# Patient Record
Sex: Female | Born: 1950 | Hispanic: Yes | Marital: Married | Smoking: Never smoker
Health system: Southern US, Community
[De-identification: ages and names within clinical notes are randomized; demographics above are authoritative.]

## PROBLEM LIST (undated history)

## (undated) DIAGNOSIS — M1990 Unspecified osteoarthritis, unspecified site: Secondary | ICD-10-CM

## (undated) DIAGNOSIS — F419 Anxiety disorder, unspecified: Secondary | ICD-10-CM

## (undated) DIAGNOSIS — Z87442 Personal history of urinary calculi: Secondary | ICD-10-CM

## (undated) DIAGNOSIS — E079 Disorder of thyroid, unspecified: Secondary | ICD-10-CM

## (undated) DIAGNOSIS — E7800 Pure hypercholesterolemia, unspecified: Secondary | ICD-10-CM

## (undated) DIAGNOSIS — F32A Depression, unspecified: Secondary | ICD-10-CM

## (undated) DIAGNOSIS — N200 Calculus of kidney: Secondary | ICD-10-CM

## (undated) DIAGNOSIS — D649 Anemia, unspecified: Secondary | ICD-10-CM

## (undated) DIAGNOSIS — I10 Essential (primary) hypertension: Secondary | ICD-10-CM

## (undated) DIAGNOSIS — J45909 Unspecified asthma, uncomplicated: Secondary | ICD-10-CM

## (undated) HISTORY — PX: ABDOMINAL HYSTERECTOMY: SHX81

---

## 2018-06-09 ENCOUNTER — Other Ambulatory Visit: Payer: Self-pay

## 2018-06-09 ENCOUNTER — Ambulatory Visit
Admission: EM | Admit: 2018-06-09 | Discharge: 2018-06-09 | Disposition: A | Discharge status: Home / Self Care | Payer: Medicare (Managed Care) | Attending: Family Medicine | Admitting: Family Medicine

## 2018-06-09 ENCOUNTER — Encounter: Payer: Self-pay | Admitting: Emergency Medicine

## 2018-06-09 DIAGNOSIS — M545 Low back pain, unspecified: Secondary | ICD-10-CM

## 2018-06-09 DIAGNOSIS — S01512A Laceration without foreign body of oral cavity, initial encounter: Secondary | ICD-10-CM

## 2018-06-09 DIAGNOSIS — R6884 Jaw pain: Secondary | ICD-10-CM | POA: Diagnosis not present

## 2018-06-09 DIAGNOSIS — W19XXXA Unspecified fall, initial encounter: Secondary | ICD-10-CM | POA: Diagnosis not present

## 2018-06-09 DIAGNOSIS — W228XXA Striking against or struck by other objects, initial encounter: Secondary | ICD-10-CM

## 2018-06-09 HISTORY — DX: Essential (primary) hypertension: I10

## 2018-06-09 HISTORY — DX: Pure hypercholesterolemia, unspecified: E78.00

## 2018-06-09 HISTORY — DX: Disorder of thyroid, unspecified: E07.9

## 2018-06-09 NOTE — ED Provider Notes (Signed)
[facility]    CSN: [id number] Arrival date & time: 06/09/18  1612     History   Chief Complaint Chief Complaint  Patient presents with  . Fall    HPI Carrie Bryant is a 67 y.o. female.   HPI 67 year old female presents today accompanied by her daughter's girlfriend she fell in the bathroom earlier today.  The patient is visiting from [place].  States that she slipped while getting out of the tub and hit her left side of her jaw against the tub itself.  She lacerated her tongue and gums.  Biggest complaint however seems to be the lower jaw on the left side of her face.  The patient is essentially edentulous.  Not endorse loss of consciousness.  Not on anticoagulation medications.  Is also complaining of right-sided low back pain.  A walking boot from previous injury.        Past Medical History:  Diagnosis Date  . Hypercholesterolemia   . Hypertension   . Thyroid disease     There are no active problems to display for this patient.   Past Surgical History:  Procedure Laterality Date  . ABDOMINAL HYSTERECTOMY      OB History   None      Home Medications    Prior to Admission medications   Medication Sig Start Date End Date Taking? Authorizing Provider  clonazePAM (KLONOPIN) 2 MG tablet Take 2 mg by mouth 2 (two) times daily.   Yes [provider]  enalapril (VASOTEC) 10 MG tablet Take 10 mg by mouth daily.   Yes [provider]  Levothyroxine Sodium (SYNTHROID PO) Take by mouth.   Yes [provider]  simvastatin (ZOCOR) 20 MG tablet Take 20 mg by mouth daily.   Yes [provider]  traZODone (DESYREL) 150 MG tablet Take by mouth at bedtime.   Yes [provider]    Family History History reviewed. No pertinent family history.  Social History Social History   Tobacco Use  . Smoking status: Never Smoker  . Smokeless tobacco: Never Used  Substance Use Topics  . Alcohol use: Never   Frequency: Never  . Drug use: Never     Allergies   Codeine   Review of Systems Review of Systems  Constitutional: Positive for activity change. Negative for appetite change, chills, fatigue and fever.  HENT: Positive for facial swelling and mouth sores.   Musculoskeletal: Positive for back pain.  All other systems reviewed and are negative.    Physical Exam Triage Vital Signs ED Triage Vitals  Enc Vitals Group     BP 06/09/18 1646 (!) 146/91     Pulse Rate 06/09/18 1646 75     Resp 06/09/18 1646 18     Temp 06/09/18 1646 98.3 F (36.8 C)     Temp Source 06/09/18 1646 Oral     SpO2 06/09/18 1646 97 %     Weight 06/09/18 1653 138 lb (62.6 kg)     Height 06/09/18 1653 5' (1.524 m)     Head Circumference --      Peak Flow --      Pain Score 06/09/18 1652 8     Pain Loc --      Pain Edu? --      Excl. in [place]? --    No data found.  Updated Vital Signs BP (!) 146/91 (BP Location: Left Arm)   Pulse 75   Temp 98.3 F (36.8 C) (Oral)  Resp 18   Ht 5' (1.524 m)   Wt 138 lb (62.6 kg)   SpO2 97%   BMI 26.95 kg/m   Visual Acuity Right Eye Distance:   Left Eye Distance:   Bilateral Distance:    Right Eye Near:   Left Eye Near:    Bilateral Near:     Physical Exam  Constitutional: She is oriented to person, place, and time. She appears well-developed and well-nourished. No distress.  HENT:  Head: Normocephalic.  Eyes: Pupils are equal, round, and reactive to light. EOM are normal.  Neck: Normal range of motion.  Emanation of the cervical spine shows no tenderness of the paraspinous muscles or along the spinous processes.  The face shows a swelling of the mandible on the left.  No crepitus is felt.  Patient has less than 1 cm laceration of the knee and the left distal and underneath the tongue.  Has small laceration on the upper gumline.  Patient is edentulous.    Musculoskeletal: She exhibits tenderness.  She has tenderness to palpation of the right flank and  lumbar paraspinous muscles on the right.  Neurological: She is alert and oriented to person, place, and time.  Skin: Skin is warm and dry. She is not diaphoretic.  Psychiatric: She has a normal mood and affect. Her behavior is normal. Judgment and thought content normal.  Nursing note and vitals reviewed.    UC Treatments / Results  Labs (all labs ordered are listed, but only abnormal results are displayed) Labs Reviewed - No data to display  EKG None  Radiology No results found.  Procedures Procedures (including critical care time)  Medications Ordered in UC Medications - No data to display  Initial Impression / Assessment and Plan / UC Course  I have reviewed the triage vital signs and the nursing notes.  Pertinent labs & imaging results that were available during my care of the patient were reviewed by me and considered in my medical decision making (see chart for details).   Explained to the patient and her companion that do not have a CAT scan available to [place] tonight.  Is likely that a CAT scan would be necessary to evaluate the jaw pain that she has since the fall.  Tongue does not appear to need suturing.  Will also need to be evaluated for the cervical spine as well as the lumbar spine.  She will be transported by the companion to [facility] emergency department as there are choice.  She was stable at the time of discharge.   Final Clinical Impressions(s) / UC Diagnoses   Final diagnoses:  Jaw pain, non-TMJ  Tongue laceration, initial encounter  Acute right-sided low back pain, unspecified whether sciatica present  Fall, initial encounter   Discharge Instructions   None    ED Prescriptions    None     Controlled Substance Prescriptions [place] Controlled Substance Registry consulted? Not Applicable   Lorin Picket, PA-C 06/09/18 1827

## 2018-06-09 NOTE — ED Triage Notes (Signed)
Patient visiting from [place]

## 2018-06-09 NOTE — ED Triage Notes (Signed)
Patient c/o fall in the bathroom today. She stated she hit the left side of her jaw and face when she fell. Patient stated her tongue is ripped.

## 2019-09-28 ENCOUNTER — Ambulatory Visit: Payer: Medicare (Managed Care) | Attending: Internal Medicine

## 2019-09-28 DIAGNOSIS — Z23 Encounter for immunization: Secondary | ICD-10-CM

## 2019-09-28 NOTE — Progress Notes (Signed)
   Covid-19 Vaccination Clinic  Name:  Carrie Bryant    MRN: [id number] DOB: 17-Oct-1950  09/28/2019  Ms. Carrie Bryant was observed post Covid-19 immunization for 15 minutes without incident. She was provided with Vaccine Information Sheet and instruction to access the V-Safe system.   Ms. Carrie Bryant was instructed to call 911 with any severe reactions post vaccine: Marland Kitchen Difficulty breathing  . Swelling of face and throat  . A fast heartbeat  . A bad rash all over body  . Dizziness and weakness   Immunizations Administered    Name Date Dose VIS Date Route   Pfizer COVID-19 Vaccine 09/28/2019 11:37 AM 0.3 mL 06/12/2019 Intramuscular   Manufacturer: [place]   Lot: [id number]   [place]: [id number]

## 2019-10-19 ENCOUNTER — Ambulatory Visit: Payer: Medicare HMO | Attending: Internal Medicine

## 2019-10-19 DIAGNOSIS — Z23 Encounter for immunization: Secondary | ICD-10-CM

## 2019-10-19 NOTE — Progress Notes (Signed)
   Covid-19 Vaccination Clinic  Name:  Carrie Bryant    MRN: [id number] DOB: 09/13/1950  10/19/2019  Ms. Carrie Bryant was observed post Covid-19 immunization for 15 minutes without incident. She was provided with Vaccine Information Sheet and instruction to access the V-Safe system.   Ms. Carrie Bryant was instructed to call 911 with any severe reactions post vaccine: Marland Kitchen Difficulty breathing  . Swelling of face and throat  . A fast heartbeat  . A bad rash all over body  . Dizziness and weakness   Immunizations Administered    Name Date Dose VIS Date Route   Pfizer COVID-19 Vaccine 10/19/2019 10:16 AM 0.3 mL 08/26/2018 Intramuscular   Manufacturer: [employer]   Lot: [id number]   [place]: [id number]

## 2020-01-07 ENCOUNTER — Ambulatory Visit (INDEPENDENT_AMBULATORY_CARE_PROVIDER_SITE_OTHER): Payer: Medicare HMO

## 2020-01-07 ENCOUNTER — Other Ambulatory Visit: Payer: Self-pay

## 2020-01-07 ENCOUNTER — Ambulatory Visit
Admission: EM | Admit: 2020-01-07 | Discharge: 2020-01-07 | Disposition: A | Discharge status: Home / Self Care | Payer: Medicare HMO | Attending: Internal Medicine | Admitting: Internal Medicine

## 2020-01-07 DIAGNOSIS — J4521 Mild intermittent asthma with (acute) exacerbation: Secondary | ICD-10-CM | POA: Diagnosis not present

## 2020-01-07 DIAGNOSIS — J189 Pneumonia, unspecified organism: Secondary | ICD-10-CM

## 2020-01-07 HISTORY — DX: Unspecified asthma, uncomplicated: J45.909

## 2020-01-07 MED ORDER — METHYLPREDNISOLONE SODIUM SUCC 40 MG IJ SOLR
60.0000 mg | Freq: Once | INTRAMUSCULAR | Status: AC
  Administered 2020-01-07: 60 mg via INTRAMUSCULAR

## 2020-01-07 MED ORDER — AMOXICILLIN-POT CLAVULANATE 875-125 MG PO TABS: 1.0000 | ORAL_TABLET | Freq: Two times a day (BID) | ORAL | 0 refills | Status: DC

## 2020-01-07 MED ORDER — ALBUTEROL SULFATE HFA 108 (90 BASE) MCG/ACT IN AERS: 2.0000 | INHALATION_SPRAY | RESPIRATORY_TRACT | 0 refills | Status: AC | PRN

## 2020-01-07 MED ORDER — AZITHROMYCIN 250 MG PO TABS: 250.0000 mg | ORAL_TABLET | Freq: Every day | ORAL | 0 refills | Status: DC

## 2020-01-07 MED ORDER — KETOROLAC TROMETHAMINE 30 MG/ML IJ SOLN
30.0000 mg | Freq: Once | INTRAMUSCULAR | Status: AC
  Administered 2020-01-07: 30 mg via INTRAMUSCULAR

## 2020-01-07 MED ORDER — AMOXICILLIN-POT CLAVULANATE 875-125 MG PO TABS: 1.0000 | ORAL_TABLET | Freq: Two times a day (BID) | ORAL | 0 refills | Status: AC

## 2020-01-07 MED ORDER — GUAIFENESIN ER 600 MG PO TB12: 600.0000 mg | ORAL_TABLET | Freq: Two times a day (BID) | ORAL | 0 refills | Status: AC

## 2020-01-07 MED ORDER — PREDNISONE 20 MG PO TABS: 20.0000 mg | ORAL_TABLET | Freq: Every day | ORAL | 0 refills | Status: AC

## 2020-01-07 NOTE — ED Provider Notes (Signed)
[facility]    CSN: [id number] Arrival date & time: 01/07/20  1159      History   Chief Complaint Chief Complaint  Patient presents with  . asthma exacerbation  . Chest Pain    HPI Carrie Bryant is a 69 y.o. female comes to the urgent care with complaints of worsening shortness of breath, wheezing and a cough productive of clear sputum over the past 3 days.  Patient has a history of asthma and uses both nebulizers and albuterol inhalers at home.  Over the past few days she is using nebulizers on a regular basis with no improvement in his symptoms.  She has chest tightness and chest pain.  No fever or chills.  No dizziness, near syncope or syncopal episodes.   HPI  Past Medical History:  Diagnosis Date  . Asthma   . Hypercholesterolemia   . Hypertension   . Thyroid disease     There are no problems to display for this patient.   Past Surgical History:  Procedure Laterality Date  . ABDOMINAL HYSTERECTOMY      OB History   No obstetric history on file.      Home Medications    Prior to Admission medications   Medication Sig Start Date End Date Taking? Authorizing Provider  albuterol (PROVENTIL) (2.5 MG/3ML) 0.083% nebulizer solution Take 2.5 mg by nebulization every 6 (six) hours as needed for wheezing or shortness of breath.   Yes [provider]  albuterol (VENTOLIN HFA) 108 (90 Base) MCG/ACT inhaler Inhale 2 puffs into the lungs every 4 (four) hours as needed for wheezing or shortness of breath. 01/07/20   Chase Picket, MD  amoxicillin-clavulanate (AUGMENTIN) 875-125 MG tablet Take 1 tablet by mouth every 12 (twelve) hours for 5 days. 01/07/20 01/12/20  LampteyMyrene Galas, MD  azithromycin (ZITHROMAX Z-PAK) 250 MG tablet Take 1 tablet (250 mg total) by mouth daily. Please take 2 tablets on Day 1 then take 1 tablet daily from Day 2 to Day 5 01/07/20   Ayme Short, Myrene Galas, MD  clonazePAM (KLONOPIN) 2 MG tablet Take 2 mg by mouth 2 (two) times  daily.    [provider]  enalapril (VASOTEC) 10 MG tablet Take 10 mg by mouth daily.    [provider]  escitalopram (LEXAPRO) 20 MG tablet Take 20 mg by mouth daily. 10/20/19   [provider]  guaiFENesin (MUCINEX) 600 MG 12 hr tablet Take 1 tablet (600 mg total) by mouth 2 (two) times daily for 10 days. 01/07/20 01/17/20  Chase Picket, MD  Levothyroxine Sodium (SYNTHROID PO) Take by mouth.    [provider]  montelukast (SINGULAIR) 10 MG tablet Take 10 mg by mouth daily. 12/15/19   [provider]  predniSONE (DELTASONE) 20 MG tablet Take 1 tablet (20 mg total) by mouth daily for 5 days. 01/07/20 01/12/20  Chase Picket, MD  simvastatin (ZOCOR) 20 MG tablet Take 20 mg by mouth daily.    [provider]  traZODone (DESYREL) 150 MG tablet Take by mouth at bedtime.    [provider]    Family History History reviewed. No pertinent family history.  Social History Social History   Tobacco Use  . Smoking status: Never Smoker  . Smokeless tobacco: Never Used  Substance Use Topics  . Alcohol use: Never  . Drug use: Never     Allergies   Morphine and related and Codeine   Review of Systems Review of  Systems  Constitutional: Negative for activity change, fatigue and fever.  Respiratory: Positive for cough, chest tightness, shortness of breath and wheezing.   Cardiovascular: Positive for chest pain. Negative for palpitations.  Gastrointestinal: Negative for abdominal pain, diarrhea, nausea and vomiting.  Genitourinary: Negative.   Musculoskeletal: Negative for arthralgias, gait problem and myalgias.     Physical Exam Triage Vital Signs ED Triage Vitals  Enc Vitals Group     BP 01/07/20 1207 139/85     Pulse Rate 01/07/20 1207 72     Resp 01/07/20 1207 19     Temp 01/07/20 1207 97.7 F (36.5 C)     Temp Source 01/07/20 1207 Oral     SpO2 01/07/20 1207 97 %     Weight 01/07/20 1206 152 lb (68.9 kg)      Height 01/07/20 1206 4\' 10"  (1.473 m)     Head Circumference --      Peak Flow --      Pain Score 01/07/20 1206 8     Pain Loc --      Pain Edu? --      Excl. in [place]? --    No data found.  Updated Vital Signs BP 139/85 (BP Location: Left Arm)   Pulse 72   Temp 97.7 F (36.5 C) (Oral)   Resp 19   Ht 4\' 10"  (1.473 m)   Wt 68.9 kg   SpO2 97%   BMI 31.77 kg/m   Visual Acuity Right Eye Distance:   Left Eye Distance:   Bilateral Distance:    Right Eye Near:   Left Eye Near:    Bilateral Near:     Physical Exam Vitals and nursing note reviewed.  Constitutional:      General: She is not in acute distress.    Appearance: She is well-developed. She is not ill-appearing.  Cardiovascular:     Rate and Rhythm: Normal rate and regular rhythm.     Heart sounds: Normal heart sounds.  Pulmonary:     Effort: Tachypnea present. No respiratory distress.     Breath sounds: No stridor. Examination of the right-lower field reveals decreased breath sounds and wheezing. Examination of the left-lower field reveals decreased breath sounds and wheezing. Decreased breath sounds and wheezing present. No rhonchi.  Chest:     Chest wall: No mass or deformity.  Abdominal:     General: Bowel sounds are normal.     Palpations: Abdomen is soft.  Neurological:     Mental Status: She is alert.      UC Treatments / Results  Labs (all labs ordered are listed, but only abnormal results are displayed) Labs Reviewed - No data to display  EKG   Radiology DG Chest 2 View  Result Date: 01/07/2020 CLINICAL DATA:  Shortness of breath and wheezing. EXAM: CHEST - 2 VIEW COMPARISON:  None FINDINGS: Trachea midline. Cardiomediastinal contours and hilar structures with moderate cardiac enlargement accentuated by low lung volumes on AP view. Hilar structures are normal. Suspect lingular opacity but also with RIGHT middle lobe platelike atelectatic changes in the RIGHT chest. Limited assessment of skeletal  structures with age-indeterminate loss of height of the T12 vertebral body. This is mild in could even represent prominent Schmorl's node. IMPRESSION: 1. Lingular and basilar changes more suggestive of hemidiaphragmatic atelectatic changes though more visible on lateral view raising the question of early pneumonia in the lingula. 2. Moderate cardiac enlargement accentuated by low lung volumes on AP view. 3. Age-indeterminate at T12  versus Schmorl's node. Correlate with any pain in this area. Electronically Signed   By: Zetta Bills M.D.   On: 01/07/2020 12:57    Procedures Procedures (including critical care time)  Medications Ordered in UC Medications  methylPREDNISolone sodium succinate (SOLU-MEDROL) 40 mg/mL injection 60 mg (60 mg Intramuscular Given 01/07/20 1237)  ketorolac (TORADOL) 30 MG/ML injection 30 mg (30 mg Intramuscular Given 01/07/20 1237)    Initial Impression / Assessment and Plan / UC Course  I have reviewed the triage vital signs and the nursing notes.  Pertinent labs & imaging results that were available during my care of the patient were reviewed by me and considered in my medical decision making (see chart for details).     1.  Mild intermittent asthma with acute exacerbation: Albuterol inhaler Solu-Medrol 60 mg IM x1 dose Toradol 30 mg x 1 dose Prednisone 20 mg orally daily for 5 days  2.  Community-acquired pneumonia: Chest x-ray is indicative of early lingular pneumonia Augmentin 875-125 mg twice daily for 5 days Azithromycin-Z-Pak for 5 days Return precautions given Patient is advised to return to urgent care if his symptoms are worsening. Final Clinical Impressions(s) / UC Diagnoses   Final diagnoses:  Mild intermittent asthma with acute exacerbation  Community acquired pneumonia, unspecified laterality   Discharge Instructions   None    ED Prescriptions    Medication Sig Dispense Auth. Provider   albuterol (VENTOLIN HFA) 108 (90 Base) MCG/ACT  inhaler Inhale 2 puffs into the lungs every 4 (four) hours as needed for wheezing or shortness of breath. 18 g Refugio Mcconico, Myrene Galas, MD   predniSONE (DELTASONE) 20 MG tablet Take 1 tablet (20 mg total) by mouth daily for 5 days. 5 tablet Azzie Thiem, Myrene Galas, MD   guaiFENesin (MUCINEX) 600 MG 12 hr tablet Take 1 tablet (600 mg total) by mouth 2 (two) times daily for 10 days. 20 tablet Hamish Banks, Myrene Galas, MD   amoxicillin-clavulanate (AUGMENTIN) 875-125 MG tablet  (Status: Discontinued) Take 1 tablet by mouth every 12 (twelve) hours for 5 days. 10 tablet Sapphire Tygart, Myrene Galas, MD   azithromycin (ZITHROMAX Z-PAK) 250 MG tablet  (Status: Discontinued) Take 1 tablet (250 mg total) by mouth daily. Please take 2 tablets on Day 1 then take 1 tablet daily from Day 2 to Day 5 6 tablet Jeffie Spivack, Myrene Galas, MD   amoxicillin-clavulanate (AUGMENTIN) 875-125 MG tablet Take 1 tablet by mouth every 12 (twelve) hours for 5 days. 10 tablet Ashonti Leandro, Myrene Galas, MD   azithromycin (ZITHROMAX Z-PAK) 250 MG tablet Take 1 tablet (250 mg total) by mouth daily. Please take 2 tablets on Day 1 then take 1 tablet daily from Day 2 to Day 5 6 tablet Kenyata Napier, Myrene Galas, MD     PDMP not reviewed this encounter.   Chase Picket, MD 01/07/20 [phone]

## 2020-01-07 NOTE — ED Triage Notes (Signed)
Pt with 3 days of coughing, wheezing and her inhalers aren't working. Chest hurts "from all the coughing"

## 2020-03-04 ENCOUNTER — Telehealth: Payer: Medicare HMO

## 2020-04-11 DIAGNOSIS — M810 Age-related osteoporosis without current pathological fracture: Secondary | ICD-10-CM | POA: Insufficient documentation

## 2020-04-11 DIAGNOSIS — M0609 Rheumatoid arthritis without rheumatoid factor, multiple sites: Secondary | ICD-10-CM | POA: Insufficient documentation

## 2020-04-11 DIAGNOSIS — G8929 Other chronic pain: Secondary | ICD-10-CM | POA: Insufficient documentation

## 2020-04-11 DIAGNOSIS — M159 Polyosteoarthritis, unspecified: Secondary | ICD-10-CM | POA: Insufficient documentation

## 2020-04-11 DIAGNOSIS — M545 Low back pain, unspecified: Secondary | ICD-10-CM | POA: Insufficient documentation

## 2020-04-18 ENCOUNTER — Telehealth (INDEPENDENT_AMBULATORY_CARE_PROVIDER_SITE_OTHER): Payer: Medicare HMO | Admitting: Gastroenterology

## 2020-04-18 DIAGNOSIS — Z1211 Encounter for screening for malignant neoplasm of colon: Secondary | ICD-10-CM

## 2020-04-18 MED ORDER — NA SULFATE-K SULFATE-MG SULF 17.5-3.13-1.6 GM/177ML PO SOLN: 1.0000 | Freq: Once | ORAL | 0 refills | Status: AC

## 2020-04-18 NOTE — Progress Notes (Signed)
Gastroenterology Pre-Procedure Review  Request Date: 05/02/20 Requesting Physician: Dr. Bonna Gains  PATIENT REVIEW QUESTIONS: The patient responded to the following health history questions as indicated:    1. Are you having any GI issues? yes (pain and heavy feeling after eating.  soft stools) 2. Do you have a personal history of Polyps? no 3. Do you have a family history of Colon Cancer or Polyps? no 4. Diabetes Mellitus? no 5. Joint replacements in the past 12 months?no 6. Major health problems in the past 3 months?pneumononia in July 2021 7. Any artificial heart valves, MVP, or defibrillator?no    MEDICATIONS & ALLERGIES:    Patient reports the following regarding taking any anticoagulation/antiplatelet therapy:   Plavix, Coumadin, Eliquis, Xarelto, Lovenox, Pradaxa, Brilinta, or Effient? no Aspirin? no  Patient confirms/reports the following medications:  Current Outpatient Medications  Medication Sig Dispense Refill   albuterol (PROVENTIL) (2.5 MG/3ML) 0.083% nebulizer solution Take 2.5 mg by nebulization every 6 (six) hours as needed for wheezing or shortness of breath.     albuterol (VENTOLIN HFA) 108 (90 Base) MCG/ACT inhaler Inhale 2 puffs into the lungs every 4 (four) hours as needed for wheezing or shortness of breath. 18 g 0   celecoxib (CELEBREX) 100 MG capsule      clonazePAM (KLONOPIN) 2 MG tablet Take 2 mg by mouth 2 (two) times daily.     escitalopram (LEXAPRO) 20 MG tablet Take 20 mg by mouth daily.     furosemide (LASIX) 20 MG tablet      ID NOW COVID-19 KIT See admin instructions.     levothyroxine (SYNTHROID) 25 MCG tablet      Levothyroxine Sodium (SYNTHROID PO) Take by mouth.     montelukast (SINGULAIR) 10 MG tablet Take 10 mg by mouth daily.     Na Sulfate-K Sulfate-Mg Sulf 17.5-3.13-1.6 GM/177ML SOLN Take 1 kit by mouth once for 1 dose. 354 mL 0   simvastatin (ZOCOR) 20 MG tablet Take 20 mg by mouth daily.     traZODone (DESYREL) 150 MG tablet  Take by mouth at bedtime.     valsartan (DIOVAN) 80 MG tablet      enalapril (VASOTEC) 10 MG tablet Take 10 mg by mouth daily. (Patient not taking: Reported on 04/18/2020)     No current facility-administered medications for this visit.    Patient confirms/reports the following allergies:  Allergies  Allergen Reactions   Morphine And Related Anaphylaxis   Codeine    Morphine Other (See Comments)    Orders Placed This Encounter  Procedures   Procedural/ Surgical Case Request: COLONOSCOPY WITH PROPOFOL    Standing Status:   Standing    Number of Occurrences:   1    Order Specific Question:   Pre-op diagnosis    Answer:   SCREENING COLONOSCOPY    Order Specific Question:   CPT Code    Answer:   [phone]    Order Specific Question:   Special needs    Answer:   Spanish Speaking Interpreter    AUTHORIZATION INFORMATION Primary Insurance: 1D#: Group #:  Secondary Insurance: 1D#: Group #:  SCHEDULE INFORMATION: Date: 05/02/20 Time: Location:[facility]

## 2020-04-26 ENCOUNTER — Other Ambulatory Visit: Payer: Self-pay

## 2020-04-26 ENCOUNTER — Encounter: Payer: Self-pay | Admitting: Gastroenterology

## 2020-04-28 ENCOUNTER — Other Ambulatory Visit: Payer: Self-pay

## 2020-04-28 ENCOUNTER — Other Ambulatory Visit
Admission: RE | Admit: 2020-04-28 | Discharge: 2020-04-28 | Disposition: A | Discharge status: Home / Self Care | Payer: Medicare HMO | Source: Ambulatory Visit | Attending: Gastroenterology | Admitting: Gastroenterology

## 2020-04-28 DIAGNOSIS — Z01812 Encounter for preprocedural laboratory examination: Secondary | ICD-10-CM | POA: Diagnosis present

## 2020-04-28 DIAGNOSIS — Z20822 Contact with and (suspected) exposure to covid-19: Secondary | ICD-10-CM | POA: Insufficient documentation

## 2020-04-29 LAB — SARS CORONAVIRUS 2 (TAT 6-24 HRS): SARS Coronavirus 2: NEGATIVE

## 2020-04-29 NOTE — Discharge Instructions (Signed)
Anestesia general en adultos, cuidados posteriores General Anesthesia, Adult, Care After Le[place] esta informacin sobre cmo cuidarse despus del procedimiento. El mdico tambin podr darle instrucciones ms especficas. Comunquese con su mdico si tiene problemas o preguntas. Qu puedo esperar despus del procedimiento? Luego del procedimiento, son comunes los siguiente efectos secundarios:  Dolor o molestias en el lugar de la va intravenosa (i.v.).  Nuseas.  Vmitos.  Dolor de garganta.  Dificultad para concentrarse.  Sentir fro o tener escalofros.  Debilidad o cansancio.  Somnolencia y fatiga.  Malestar y dolor corporal. Estos efectos secundarios pueden afectar partes del cuerpo que no estuvieron involucradas en la ciruga. Siga estas indicaciones en su casa:  Durante al menos 24horas despus del procedimiento:  Pdale a un adulto responsable que permanezca con usted. Es importante que alguien cuide de usted hasta que se despierte y est alerta.  Descanse todo lo que sea necesario.  No haga lo siguiente: ? Participar en actividades en las que podra caerse o lastimarse. ? Conducir. ? Operar maquinarias pesadas. ? Beber alcohol. ? Tomar somnferos o medicamentos que causen somnolencia. ? Firmar documentos legales ni tomar decisiones importantes. ? Cuidar a nios por su cuenta. Qu debe comer y beber  Siga las indicaciones del mdico respecto de las restricciones de comidas o bebidas.  Cuando tenga hambre, comience a comer cantidades pequeas de alimentos que sean blandos y fciles de digerir (livianos), como una tostada. Retome su dieta habitual de forma gradual.  Beba suficiente lquido como para mantener la orina de color amarillo plido.  Si vomita, rehidrtese tomando agua, jugo o caldo transparente. Instrucciones generales  Si tiene apnea del sueo, la ciruga y ciertos medicamentos pueden aumentar el riesgo de problemas respiratorios. Siga las  indicaciones del mdico respecto al uso de su dispositivo para dormir: ? Siempre que duerma, incluso durante las siestas que tome en el da. ? Mientras tome analgsicos recetados, medicamentos para dormir o medicamentos que producen somnolencia.  Reanude sus actividades normales segn lo indicado por el mdico. Pregntele al mdico qu actividades son seguras para usted.  Tome los medicamentos de venta libre y los recetados solamente como se lo haya indicado el mdico.  Si fuma, no lo haga sin supervisin.  Concurra a todas las visitas de seguimiento como se lo haya indicado el mdico. Esto es importante. Comunquese con un mdico si:  Tiene nuseas o vmitos que no mejoran con medicamentos.  No puede comer ni beber sin vomitar.  El dolor no se alivia con medicamentos.  No puede orinar.  Tiene una erupcin cutnea.  Tiene fiebre.  Presenta enrojecimiento alrededor del lugar de la va intravenosa (i.v.) que empeora. Solicite ayuda de inmediato si:  Tiene dificultad para respirar.  Siente dolor en el pecho.  Observa sangre en la orina o heces, o vomita sangre. Resumen  Despus del procedimiento, es comn tener dolor de garganta y nuseas. Tambin es comn sentirse cansado.  Pdale a un adulto responsable que permanezca con usted durante 24 horas despus de la anestesia general. Es importante que alguien cuide de usted hasta que se despierte y est alerta.  Cuando tenga hambre, comience a comer cantidades pequeas de alimentos que sean blandos y fciles de digerir (livianos), como una tostada. Retome su dieta habitual de forma gradual.  Beba suficiente lquido como para mantener la orina de color amarillo plido.  Reanude sus actividades normales segn lo indicado por el mdico. Pregntele al mdico qu actividades son seguras para usted. Esta informacin no tiene como fin reemplazar   el consejo del mdico. Asegrese de hacerle al mdico cualquier pregunta que tenga. Document  Revised: 04/15/2017 Document Reviewed: 04/15/2017 Elsevier Patient Education  2020 [employer]  

## 2020-05-02 ENCOUNTER — Encounter
Admission: RE | Disposition: A | Discharge status: Home / Self Care | Payer: Self-pay | Source: Home / Self Care | Attending: Gastroenterology

## 2020-05-02 ENCOUNTER — Ambulatory Visit: Payer: Medicare HMO | Admitting: Anesthesiology

## 2020-05-02 ENCOUNTER — Ambulatory Visit
Admission: RE | Admit: 2020-05-02 | Discharge: 2020-05-02 | Disposition: A | Discharge status: Home / Self Care | Payer: Medicare HMO | Attending: Gastroenterology | Admitting: Gastroenterology

## 2020-05-02 DIAGNOSIS — D122 Benign neoplasm of ascending colon: Secondary | ICD-10-CM

## 2020-05-02 DIAGNOSIS — Z87892 Personal history of anaphylaxis: Secondary | ICD-10-CM | POA: Diagnosis not present

## 2020-05-02 DIAGNOSIS — Z1211 Encounter for screening for malignant neoplasm of colon: Secondary | ICD-10-CM

## 2020-05-02 DIAGNOSIS — D123 Benign neoplasm of transverse colon: Secondary | ICD-10-CM | POA: Diagnosis not present

## 2020-05-02 DIAGNOSIS — K635 Polyp of colon: Secondary | ICD-10-CM | POA: Insufficient documentation

## 2020-05-02 DIAGNOSIS — D374 Neoplasm of uncertain behavior of colon: Secondary | ICD-10-CM | POA: Insufficient documentation

## 2020-05-02 DIAGNOSIS — Z885 Allergy status to narcotic agent status: Secondary | ICD-10-CM | POA: Diagnosis not present

## 2020-05-02 DIAGNOSIS — Z87891 Personal history of nicotine dependence: Secondary | ICD-10-CM | POA: Diagnosis not present

## 2020-05-02 HISTORY — DX: Personal history of urinary calculi: Z87.442

## 2020-05-02 HISTORY — DX: Unspecified osteoarthritis, unspecified site: M19.90

## 2020-05-02 HISTORY — PX: POLYPECTOMY: SHX5525

## 2020-05-02 HISTORY — PX: COLONOSCOPY WITH PROPOFOL: SHX5780

## 2020-05-02 SURGERY — COLONOSCOPY WITH PROPOFOL
Anesthesia: General | Site: Rectum

## 2020-05-02 MED ORDER — LACTATED RINGERS IV SOLN: INTRAVENOUS | Status: DC

## 2020-05-02 MED ORDER — LIDOCAINE HCL (CARDIAC) PF 100 MG/5ML IV SOSY
PREFILLED_SYRINGE | INTRAVENOUS | Status: DC | PRN
  Administered 2020-05-02: 40 mg via INTRAVENOUS

## 2020-05-02 MED ORDER — STERILE WATER FOR IRRIGATION IR SOLN: Status: DC | PRN

## 2020-05-02 MED ORDER — PROPOFOL 10 MG/ML IV BOLUS
INTRAVENOUS | Status: DC | PRN
  Administered 2020-05-02 (×2): 20 mg via INTRAVENOUS
  Administered 2020-05-02: 60 mg via INTRAVENOUS
  Administered 2020-05-02: 20 mg via INTRAVENOUS
  Administered 2020-05-02: 40 mg via INTRAVENOUS

## 2020-05-02 SURGICAL SUPPLY — 14 items
ELECT REM PT RETURN 9FT ADLT (ELECTROSURGICAL) ×3
ELECTRODE REM PT RTRN 9FT ADLT (ELECTROSURGICAL) ×1 IMPLANT
FORCEPS BIOP RAD 4 LRG CAP 4 (CUTTING FORCEPS) ×3 IMPLANT
GOWN CVR UNV OPN BCK APRN NK (MISCELLANEOUS) ×2 IMPLANT
GOWN ISOL THUMB LOOP REG UNIV (MISCELLANEOUS) ×6
KIT PRC NS LF DISP ENDO (KITS) ×1 IMPLANT
KIT PROCEDURE OLYMPUS (KITS) ×3
MANIFOLD NEPTUNE II (INSTRUMENTS) ×3 IMPLANT
MARKER SPOT ENDO TATTOO 5ML (MISCELLANEOUS) ×1 IMPLANT
NEEDLE CARR LOCKE SCLERO (NEEDLE) ×3 IMPLANT
SNARE LASSO HEX 3 IN 1 (INSTRUMENTS) ×3 IMPLANT
SPOT EX ENDOSCOPIC TATTOO (MISCELLANEOUS) ×2
TRAP ETRAP POLY (MISCELLANEOUS) ×3 IMPLANT
WATER STERILE IRR 250ML POUR (IV SOLUTION) ×3 IMPLANT

## 2020-05-02 NOTE — Anesthesia Procedure Notes (Signed)
Procedure Name: MAC Date/Time: 05/02/2020 1:27 PM Performed by: Silvana Newness, CRNA Pre-anesthesia Checklist: Patient identified, Emergency Drugs available, Suction available, Patient being monitored and Timeout performed Oxygen Delivery Method: Nasal cannula Placement Confirmation: positive ETCO2

## 2020-05-02 NOTE — Op Note (Signed)
[facility] Gastroenterology Patient Name: Carrie Bryant Procedure Date: 05/02/2020 1:20 PM MRN: [id number] Account #: [id number] Date of Birth: 1950-08-13 Admit Type: Outpatient Age: 69 Room: [facility] OR ROOM 01 Gender: Female Note Status: Finalized Procedure:             Colonoscopy Indications:           Screening for colorectal malignant neoplasm,                         Incidental diarrhea noted Providers:             Jeaninne Lodico B. Bonna Gains MD, MD Referring MD:          Theotis Burrow (Referring MD) Medicines:             Monitored Anesthesia Care Complications:         No immediate complications. Procedure:             Pre-Anesthesia Assessment:                        - ASA Grade Assessment: II - A patient with mild                         systemic disease.                        - Prior to the procedure, a History and Physical was                         performed, and patient medications, allergies and                         sensitivities were reviewed. The patient's tolerance                         of previous anesthesia was reviewed.                        - The risks and benefits of the procedure and the                         sedation options and risks were discussed with the                         patient. All questions were answered and informed                         consent was obtained.                        - Patient identification and proposed procedure were                         verified prior to the procedure by the physician, the                         nurse, the anesthesiologist, the anesthetist and the                         technician. The procedure was verified  in the                         procedure room.                        After obtaining informed consent, the colonoscope was                         passed under direct vision. Throughout the procedure,                         the patient's blood pressure, pulse,  and oxygen                         saturations were monitored continuously. The was                         introduced through the anus and advanced to the the                         cecum, identified by appendiceal orifice and ileocecal                         valve. The colonoscopy was performed with ease. The                         patient tolerated the procedure well. The quality of                         the bowel preparation was good. Findings:      The perianal and digital rectal examinations were normal.      A 15 mm polyp was found in the ascending colon. The polyp was sessile.       Area was successfully injected with 4 mL saline for lesion assessment,       and this injection appeared to lift the lesion adequately. The polyp was       removed with a hot snare. Resection and retrieval were complete.      Three sessile polyps were found in the transverse colon. The polyps were       4 to 5 mm in size. These polyps were removed with a cold biopsy forceps.       Resection and retrieval were complete.      Two sessile polyps were found in the sigmoid colon. The polyps were 3 to       4 mm in size. These polyps were removed with a cold biopsy forceps.       Resection and retrieval were complete.      A 25 mm, non-bleeding polyp was found in the sigmoid colon at 10 cm       proximal to the anus. The polyp was multi-lobulated. Biopsies were taken       with a cold forceps for histology. Area was tattooed with an injection       of 4 mL of Spot (carbon black), 1 fold proximal to the lesion.      The exam was otherwise without abnormality.      The rectum, sigmoid colon, descending colon, transverse colon, ascending       colon and cecum appeared normal.  The retroflexed view of the distal rectum and anal verge was normal and       showed no anal or rectal abnormalities. Impression:            - One 15 mm polyp in the ascending colon, removed with                         a hot  snare. Resected and retrieved. Injected.                        - Three 4 to 5 mm polyps in the transverse colon,                         removed with a cold biopsy forceps. Resected and                         retrieved.                        - Two 3 to 4 mm polyps in the sigmoid colon, removed                         with a cold biopsy forceps. Resected and retrieved.                        - One 25 mm, non-bleeding polyp in the sigmoid colon                         at 10 cm proximal to the anus. Biopsied. Tattooed.                        - The examination was otherwise normal.                        - The rectum, sigmoid colon, descending colon,                         transverse colon, ascending colon and cecum are normal.                        - The distal rectum and anal verge are normal on                         retroflexion view. Recommendation:        - Await pathology results.                        - Return to my office in 2 weeks.                        - Discharge patient to home (with escort).                        - Advance diet as tolerated.                        - Continue present medications.                        -  The findings and recommendations were discussed with                         the patient.                        - The findings and recommendations were discussed with                         the patient's family.                        - Return to primary care physician as previously                         scheduled. Procedure Code(s):     --- Professional ---                        [phone], Colonoscopy, flexible; with removal of                         tumor(s), polyp(s), or other lesion(s) by snare                         technique                        45381, Colonoscopy, flexible; with directed submucosal                         injection(s), any substance                        85027, 52, Colonoscopy, flexible; with biopsy, single                          or multiple Diagnosis Code(s):     --- Professional ---                        K63.5, Polyp of colon                        Z12.11, Encounter for screening for malignant neoplasm                         of colon CPT copyright 2019 American Medical Association. All rights reserved. The codes documented in this report are preliminary and upon coder review may  be revised to meet current compliance requirements.  Vonda Antigua, MD Margretta Sidle B. Bonna Gains MD, MD 05/02/2020 2:06:14 PM This report has been signed electronically. Number of Addenda: 0 Note Initiated On: 05/02/2020 1:20 PM Scope Withdrawal Time: 0 hours 24 minutes 2 seconds  Total Procedure Duration: 0 hours 26 minutes 27 seconds  Estimated Blood Loss:  Estimated blood loss: none.      [facility]

## 2020-05-02 NOTE — Anesthesia Postprocedure Evaluation (Signed)
Anesthesia Post Note  Patient: Carrie Bryant  Procedure(s) Performed: COLONOSCOPY WITH BIOPSY (N/A Rectum) POLYPECTOMY (N/A Rectum)     Patient location during evaluation: PACU Anesthesia Type: General Level of consciousness: awake and alert Pain management: pain level controlled Vital Signs Assessment: post-procedure vital signs reviewed and stable Respiratory status: spontaneous breathing and nonlabored ventilation Cardiovascular status: blood pressure returned to baseline Postop Assessment: no apparent nausea or vomiting Anesthetic complications: no   No complications documented.  Naika Noto [employer]

## 2020-05-02 NOTE — Transfer of Care (Signed)
Immediate Anesthesia Transfer of Care Note  Patient: Carrie Bryant  Procedure(s) Performed: COLONOSCOPY WITH BIOPSY (N/A Rectum) POLYPECTOMY (N/A Rectum)  Patient Location: PACU  Anesthesia Type: General  Level of Consciousness: awake, alert  and patient cooperative  Airway and Oxygen Therapy: Patient Spontanous Breathing and Patient connected to supplemental oxygen  Post-op Assessment: Post-op Vital signs reviewed, Patient's Cardiovascular Status Stable, Respiratory Function Stable, Patent Airway and No signs of Nausea or vomiting  Post-op Vital Signs: Reviewed and stable  Complications: No complications documented.

## 2020-05-02 NOTE — Anesthesia Preprocedure Evaluation (Addendum)
Anesthesia Evaluation  Patient identified by MRN, date of birth, ID band Patient awake    Reviewed: Allergy & Precautions, NPO status , Patient's Chart, lab work & pertinent test results  Airway Mallampati: II  TM Distance: >3 FB Neck ROM: Full    Dental no notable dental hx.    Pulmonary asthma , former smoker,    Pulmonary exam normal        Cardiovascular hypertension, Normal cardiovascular exam     Neuro/Psych negative neurological ROS  negative psych ROS   GI/Hepatic negative GI ROS, Neg liver ROS,   Endo/Other  Hypothyroidism BMI 30  Renal/GU negative Renal ROS     Musculoskeletal  (+) Arthritis , Osteoarthritis,    Abdominal (+) + obese,   Peds  Hematology negative hematology ROS (+)   Anesthesia Other Findings   Reproductive/Obstetrics                            Anesthesia Physical Anesthesia Plan  ASA: II  Anesthesia Plan: General   Post-op Pain Management:    Induction: Intravenous  PONV Risk Score and Plan: 3 and Propofol infusion, TIVA and Treatment may vary due to age or medical condition  Airway Management Planned: Natural Airway and Nasal Cannula  Additional Equipment: None  Intra-op Plan:   Post-operative Plan:   Informed Consent: I have reviewed the patients History and Physical, chart, labs and discussed the procedure including the risks, benefits and alternatives for the proposed anesthesia with the patient or authorized representative who has indicated his/her understanding and acceptance.     Dental advisory given and Consent reviewed with POA  Plan Discussed with: CRNA  Anesthesia Plan Comments:         Anesthesia Quick Evaluation

## 2020-05-02 NOTE — H&P (Addendum)
Vonda Antigua, MD [address] [place], [place], [place], [address] [address], [place], [place], [place], [address] Phone: [phone]  Fax: [phone]  Primary Care Physician:  Theotis Burrow, MD   Pre-Procedure History & Physical: HPI:  Carrie Bryant is a 69 y.o. female is here for a colonoscopy.   Past Medical History:  Diagnosis Date  . Arthritis   . Asthma   . History of kidney stones   . Hypercholesterolemia   . Hypertension   . Thyroid disease     Past Surgical History:  Procedure Laterality Date  . ABDOMINAL HYSTERECTOMY      Prior to Admission medications   Medication Sig Start Date End Date Taking? Authorizing Provider  albuterol (PROVENTIL) (2.5 MG/3ML) 0.083% nebulizer solution Take 2.5 mg by nebulization every 6 (six) hours as needed for wheezing or shortness of breath.   Yes [provider]  albuterol (VENTOLIN HFA) 108 (90 Base) MCG/ACT inhaler Inhale 2 puffs into the lungs every 4 (four) hours as needed for wheezing or shortness of breath. 01/07/20  Yes Lamptey, Myrene Galas, MD  celecoxib (CELEBREX) 100 MG capsule  02/10/20  Yes [provider]  clonazePAM (KLONOPIN) 2 MG tablet Take 2 mg by mouth 2 (two) times daily.   Yes [provider]  Cyanocobalamin (VITAMIN B12 PO) Take by mouth daily.   Yes [provider]  escitalopram (LEXAPRO) 20 MG tablet Take 20 mg by mouth daily. 10/20/19  Yes [provider]  furosemide (LASIX) 20 MG tablet  12/15/19  Yes [provider]  levothyroxine (SYNTHROID) 25 MCG tablet  02/21/20  Yes [provider]  montelukast (SINGULAIR) 10 MG tablet Take 10 mg by mouth daily. 12/15/19  Yes [provider]  simvastatin (ZOCOR) 20 MG tablet Take 20 mg by mouth daily.   Yes [provider]  traZODone (DESYREL) 150 MG tablet Take by mouth at bedtime.   Yes [provider]  valsartan (DIOVAN) 80 MG tablet  12/27/19  Yes [provider]  VITAMIN D PO Take by mouth.   Yes [provider]  enalapril (VASOTEC) 10 MG tablet Take 10 mg by mouth daily. Patient not taking: Reported on 04/18/2020    [provider]  ID NOW COVID-19 KIT See admin instructions. 11/07/19   [provider]    Allergies as of 04/18/2020 - Review Complete 04/18/2020  Allergen Reaction Noted  . Morphine and related Anaphylaxis 01/07/2020  . Codeine  06/09/2018  . Morphine Other (See Comments) 04/11/2020    History reviewed. No pertinent family history.  Social History   Socioeconomic History  . Marital status: Married    Spouse name: Not on file  . Number of children: Not on file  . Years of education: Not on file  . Highest education level: Not on file  Occupational History  . Not on file  Tobacco Use  . Smoking status: Former Smoker    Types: Cigarettes    Quit date: 1990    Years since quitting: 31.8  . Smokeless tobacco: Never Used  . Tobacco comment: Social smoker "long ago"  Media planner  . Vaping Use: Never used  Substance and Sexual Activity  . Alcohol use: Never  . Drug use: Never  . Sexual activity: Not on file  Other Topics Concern  . Not on file  Social History Narrative  . Not on file   Social Determinants of Health   Financial Resource Strain:   .  Difficulty of Paying Living Expenses: Not on file  Food Insecurity:   . Worried About Charity fundraiser in the Last Year: Not on file  . Ran Out of Food in the Last Year: Not on file  Transportation Needs:   . Lack of Transportation (Medical): Not on file  . Lack of Transportation (Non-Medical): Not on file  Physical Activity:   . Days of Exercise per Week: Not on file  . Minutes of Exercise per Session: Not on file  Stress:   . Feeling of Stress : Not on file  Social Connections:   . Frequency of Communication with Friends and Family: Not on file  . Frequency of Social Gatherings with Friends and Family: Not on file  .  Attends Religious Services: Not on file  . Active Member of Clubs or Organizations: Not on file  . Attends Archivist Meetings: Not on file  . Marital Status: Not on file  Intimate Partner Violence:   . Fear of Current or Ex-Partner: Not on file  . Emotionally Abused: Not on file  . Physically Abused: Not on file  . Sexually Abused: Not on file    Review of Systems: See HPI, otherwise negative ROS  Physical Exam: BP 138/75   Pulse 71   Temp 97.7 F (36.5 C)   Resp 20   Ht 4' 10" (1.473 m)   Wt 66.2 kg   SpO2 93%   BMI 30.51 kg/m  General:   Alert,  pleasant and cooperative in NAD Head:  Normocephalic and atraumatic. Neck:  Supple; no masses or thyromegaly. Lungs:  Clear throughout to auscultation, normal respiratory effort.    Heart:  +S1, +S2, Regular rate and rhythm, No edema. Abdomen:  Soft, nontender and nondistended. Normal bowel sounds, without guarding, and without rebound.   Neurologic:  Alert and  oriented x4;  grossly normal neurologically.  Impression/Plan: Carrie Bryant is here for a colonoscopy to be performed for average risk screening. Pt incidentally reports diarrhea for 1 year with 4-5 loose BMs, without blood daily. Pt should follow up in GI clinic for further discussion and workup for this  Risks, benefits, limitations, and alternatives regarding  colonoscopy have been reviewed with the patient.  Questions have been answered.  All parties agreeable.   Virgel Manifold, MD  05/02/2020, 12:26 PM

## 2020-05-03 ENCOUNTER — Encounter: Payer: Self-pay | Admitting: Gastroenterology

## 2020-05-04 LAB — SURGICAL PATHOLOGY

## 2020-05-09 ENCOUNTER — Telehealth: Payer: Self-pay

## 2020-05-09 DIAGNOSIS — K635 Polyp of colon: Secondary | ICD-10-CM

## 2020-05-09 NOTE — Telephone Encounter (Signed)
-----   Message from Irving Copas., MD sent at 05/06/2020  5:02 PM EDT ----- Regarding: Colonoscopy follow-up VT,I saw the pictures.  I think this is could be removed endoscopically if she is wanting to have another attempt.  We can get her scheduled in the coming weeks for a procedure.  I would recommend obtaining a CEA level and a CT scan just to ensure that we are not seeing something that looks more significant although endoscopically I think this mixed granular/nongranular lesion has opportunity to be removed endoscopically.If after you talk with her she wants to move forward with this (even before the imaging and labs are done) please let Jeziah Kretschmer and I know and she can work on getting this scheduled.All the best.GM ----- Message ----- From: Virgel Manifold, MD Sent: 05/05/2020   9:07 AM EDT To: Irving Copas., MD  Hi Dr. Rush Landmark,  Would you be able to look at this patient's recent procedure report. She had a sigmoid colon polyp I could not remove and is showing Villous adenoma. Would you be able to attempt removal looking at the pictures?

## 2020-05-09 NOTE — Telephone Encounter (Signed)
Called patient to let her know what Dr. Bonna Gains recommended for her to do. Patient agreed with everything and understood that Dr. Donneta Romberg office would be contacting her with an appointment. Patient had no further questions.

## 2020-05-09 NOTE — Telephone Encounter (Signed)
Virgel Manifold, MD  Mansouraty, Telford Nab., MD; Timothy Lasso, RN; Wayna Chalet, CMA Thank you for reviewing it. I will call her next week and discuss and get back to you.

## 2020-05-09 NOTE — Telephone Encounter (Signed)
-----   Message from Virgel Manifold, MD sent at 05/05/2020  3:10 PM EDT ----- Herb Grays please let the patient know, the biopsy of the large colon polyp did not show cancerous cells.  It showed precancerous cells called villous adenoma.  However, since it was not removed and only biopsied, the entire lesion would need to be removed to confirm that there is no cancer in the area.  I have reached out to a doctor in [place] to see if they can remove this.  We will get back in touch with her once we hear back from them

## 2020-05-11 ENCOUNTER — Telehealth (INDEPENDENT_AMBULATORY_CARE_PROVIDER_SITE_OTHER): Payer: Medicare HMO | Admitting: Gastroenterology

## 2020-05-11 DIAGNOSIS — K635 Polyp of colon: Secondary | ICD-10-CM | POA: Diagnosis not present

## 2020-05-11 DIAGNOSIS — K6389 Other specified diseases of intestine: Secondary | ICD-10-CM | POA: Diagnosis not present

## 2020-05-11 NOTE — Progress Notes (Signed)
Vonda Antigua, MD [address]  [place]  [place], [place] [address]  Main: [phone]  Fax: [phone]   Primary Care Physician: Theotis Burrow, MD  Virtual Visit via Telephone Note  I connected with patient on 05/11/20 at  2:00 PM EST by telephone and verified that I am speaking with the correct person using two identifiers.   I discussed the limitations, risks, security and privacy concerns of performing an evaluation and management service by telephone and the availability of in person appointments. I also discussed with the patient that there may be a patient responsible charge related to this service. The patient expressed understanding and agreed to proceed.  Location of Patient: Home Location of Provider: Home Persons involved: Patient, Spanish interpreter via [place] interpreter line, patient's daughter, and provider only during the visit (nursing staff and front desk staff was involved in communicating with the patient prior to the appointment, reviewing medications and checking them in)   History of Present Illness: Chief Complaint  Patient presents with  . Villous adenoma     HPI: Chameka Mcmullen is a 69 y.o. female recently seen for screening colonoscopy on May 02, 2020.  This was patient's first colonoscopy.  Patient denies any family history of colon cancer.  Colonoscopy November 2021: -A 25 mm multilobulated sigmoid colon polyp was seen.  This was not removed.  It was biopsied.  Area was tattooed.  Biopsy results show villous adenoma. -15 mm polyp in the ascending colon removed via hot snare injection lift.  This shows tubular adenoma. -3 sessile polyps 4 to 5 mm in size removed from the transverse colon.  They show tubular adenoma and sessile serrated polyps -2 sessile polyps in the sigmoid colon, 3 to 4 mm in size removed via biopsy forceps.  These were hyperplastic  The patient denies abdominal or flank pain, anorexia, nausea or  vomiting, dysphagia,  black or bloody stools or weight loss.   Current Outpatient Medications  Medication Sig Dispense Refill  . albuterol (PROVENTIL) (2.5 MG/3ML) 0.083% nebulizer solution Take 2.5 mg by nebulization every 6 (six) hours as needed for wheezing or shortness of breath.    Marland Kitchen albuterol (VENTOLIN HFA) 108 (90 Base) MCG/ACT inhaler Inhale 2 puffs into the lungs every 4 (four) hours as needed for wheezing or shortness of breath. 18 g 0  . celecoxib (CELEBREX) 100 MG capsule     . clonazePAM (KLONOPIN) 2 MG tablet Take 2 mg by mouth 2 (two) times daily.    . Cyanocobalamin (VITAMIN B12 PO) Take by mouth daily.    . enalapril (VASOTEC) 10 MG tablet Take 10 mg by mouth daily.     Marland Kitchen escitalopram (LEXAPRO) 20 MG tablet Take 20 mg by mouth daily.    . furosemide (LASIX) 20 MG tablet     . ID NOW COVID-19 KIT See admin instructions.    Marland Kitchen levothyroxine (SYNTHROID) 25 MCG tablet     . montelukast (SINGULAIR) 10 MG tablet Take 10 mg by mouth daily.    . simvastatin (ZOCOR) 20 MG tablet Take 20 mg by mouth daily.    . traZODone (DESYREL) 150 MG tablet Take by mouth at bedtime.    . valsartan (DIOVAN) 80 MG tablet     . VITAMIN D PO Take by mouth.     No current facility-administered medications for this visit.    Allergies as of 05/11/2020 - Review Complete 05/11/2020  Allergen Reaction Noted  . Morphine and related Anaphylaxis  01/07/2020  . Codeine  06/09/2018  . Morphine Other (See Comments) 04/11/2020    Review of Systems:    All systems reviewed and negative except where noted in HPI.   Observations/Objective:  Labs: CMP  No results found for: NA, K, CL, CO2, GLUCOSE, BUN, CREATININE, CALCIUM, PROT, ALBUMIN, AST, ALT, ALKPHOS, BILITOT, GFRNONAA, GFRAA No results found for: WBC, HGB, HCT, MCV, PLT  Imaging Studies: No results found.  Assessment and Plan:   Genine Beckett is a 69 y.o. y/o female with large multilobulated sigmoid colon polyp showing villous adenoma on  biopsies  Assessment and Plan: I have discussed the case with Dr. Rush Landmark who has reviewed her chart and the colonoscopy images.  He thinks this can be removed endoscopically  I have discussed things in detail with patient and her daughter in detail.  They are agreeable to another colonoscopy to attempt removal of the above polyp  We will obtain CT scan to ensure no concerning extraluminal findings, and CEA level.  CT scan is scheduled for November 26.  Patient can be scheduled with Dr. Rush Landmark subsequently  Follow Up Instructions:    I discussed the assessment and treatment plan with the patient. The patient was provided an opportunity to ask questions and all were answered. The patient agreed with the plan and demonstrated an understanding of the instructions.   The patient was advised to call back or seek an in-person evaluation if the symptoms worsen or if the condition fails to improve as anticipated.  I provided 22 minutes of non-face-to-face time during this encounter. Additional time was spent in reviewing patient's chart, placing orders etc.   Virgel Manifold, MD  Speech recognition software was used to dictate this note.

## 2020-05-12 ENCOUNTER — Telehealth: Payer: Self-pay

## 2020-05-12 NOTE — Telephone Encounter (Signed)
Mansouraty, Telford Nab., MD  Timothy Lasso, RN Rashema Seawright, Can you find a tentative hold for a clinic visit for this patient in December so that I can talk with her about the procedure after the CT scan is done. Certainly if the CT scan shows something that is very concerning then we will hold off on the clinic visit? Thanks.  GM

## 2020-05-12 NOTE — Telephone Encounter (Signed)
Tentative appt scheduled for 06/09/20 at 1:50 pm appt with Dr Rush Landmark.

## 2020-05-13 NOTE — Telephone Encounter (Signed)
Thanks for update. GM 

## 2020-05-18 LAB — CEA: CEA: 0.7 ng/mL (ref 0.0–4.7)

## 2020-05-27 ENCOUNTER — Other Ambulatory Visit: Payer: Self-pay

## 2020-05-27 ENCOUNTER — Ambulatory Visit
Admission: RE | Admit: 2020-05-27 | Discharge: 2020-05-27 | Disposition: A | Discharge status: Home / Self Care | Payer: Medicare HMO | Source: Ambulatory Visit | Attending: Gastroenterology | Admitting: Gastroenterology

## 2020-05-27 DIAGNOSIS — K6389 Other specified diseases of intestine: Secondary | ICD-10-CM | POA: Diagnosis not present

## 2020-05-27 LAB — POCT I-STAT CREATININE: Creatinine, Ser: 0.8 mg/dL (ref 0.44–1.00)

## 2020-05-27 MED ORDER — IOHEXOL 300 MG/ML  SOLN
100.0000 mL | Freq: Once | INTRAMUSCULAR | Status: AC | PRN
  Administered 2020-05-27: 100 mL via INTRAVENOUS

## 2020-05-31 ENCOUNTER — Telehealth: Payer: Self-pay

## 2020-05-31 NOTE — Telephone Encounter (Signed)
-----   Message from Virgel Manifold, MD sent at 05/31/2020  1:53 PM EST ----- Herb Grays please let the patient know, her CT scan does not show any lesions outside the colon.  Follow-up with Dr. Rush Landmark as scheduled

## 2020-05-31 NOTE — Telephone Encounter (Signed)
Called patient to let her know the below information and patient was glad to hear. Patient was reminded to follow up with Dr. Rush Landmark and she stated that she would. Patient had no further questions.

## 2020-06-09 ENCOUNTER — Encounter: Payer: Self-pay | Admitting: Gastroenterology

## 2020-06-09 ENCOUNTER — Ambulatory Visit (INDEPENDENT_AMBULATORY_CARE_PROVIDER_SITE_OTHER): Payer: Medicare HMO | Admitting: Gastroenterology

## 2020-06-09 ENCOUNTER — Other Ambulatory Visit (INDEPENDENT_AMBULATORY_CARE_PROVIDER_SITE_OTHER): Payer: Medicare HMO

## 2020-06-09 VITALS — BP 120/72 | HR 72 | Ht <= 58 in | Wt 151.4 lb

## 2020-06-09 DIAGNOSIS — K621 Rectal polyp: Secondary | ICD-10-CM

## 2020-06-09 DIAGNOSIS — R933 Abnormal findings on diagnostic imaging of other parts of digestive tract: Secondary | ICD-10-CM

## 2020-06-09 DIAGNOSIS — Z86010 Personal history of colonic polyps: Secondary | ICD-10-CM

## 2020-06-09 LAB — BASIC METABOLIC PANEL
BUN: 16 mg/dL (ref 6–23)
CO2: 29 mEq/L (ref 19–32)
Calcium: 9 mg/dL (ref 8.4–10.5)
Chloride: 104 mEq/L (ref 96–112)
Creatinine, Ser: 0.77 mg/dL (ref 0.40–1.20)
GFR: 78.49 mL/min (ref >60.00)
Glucose, Bld: 81 mg/dL (ref 70–99)
Potassium: 3.9 mEq/L (ref 3.5–5.1)
Sodium: 140 mEq/L (ref 135–145)

## 2020-06-09 LAB — CBC
HCT: 38.4 % (ref 36.0–46.0)
Hemoglobin: 12.4 g/dL (ref 12.0–15.0)
MCHC: 32.2 g/dL (ref 30.0–36.0)
MCV: 73.7 fl — ABNORMAL LOW (ref 78.0–100.0)
Platelets: 391 10*3/uL (ref 150.0–400.0)
RBC: 5.21 Mil/uL — ABNORMAL HIGH (ref 3.87–5.11)
RDW: 18.6 % — ABNORMAL HIGH (ref 11.5–15.5)
WBC: 8.3 10*3/uL (ref 4.0–10.5)

## 2020-06-09 LAB — PROTIME-INR
INR: 1 ratio (ref 0.8–1.0)
Prothrombin Time: 11.3 s (ref 9.6–13.1)

## 2020-06-09 NOTE — Patient Instructions (Addendum)
Your provider has requested that you go to the basement level for lab work before leaving today. Press "B" on the elevator. The lab is located at the first door on the left as you exit the elevator.   You have been scheduled for a colonoscopy. Please follow written instructions given to you at your visit today.  Please pick up your prep supplies at the pharmacy within the next 1-3 days. If you use inhalers (even only as needed), please bring them with you on the day of your procedure.   If you are age 1 or older, your body mass index should be between 23-30. Your Body mass index is 31.64 kg/m. If this is out of the aforementioned range listed, please consider follow up with your Primary Care Provider.   Thank you for choosing me and [facility].  Dr. Rush Landmark

## 2020-06-10 ENCOUNTER — Other Ambulatory Visit (INDEPENDENT_AMBULATORY_CARE_PROVIDER_SITE_OTHER): Payer: Medicare HMO

## 2020-06-10 ENCOUNTER — Encounter: Payer: Self-pay | Admitting: Gastroenterology

## 2020-06-10 DIAGNOSIS — R933 Abnormal findings on diagnostic imaging of other parts of digestive tract: Secondary | ICD-10-CM | POA: Diagnosis not present

## 2020-06-10 LAB — IBC + FERRITIN
Ferritin: 5.4 ng/mL — ABNORMAL LOW (ref 10.0–291.0)
Iron: 20 ug/dL — ABNORMAL LOW (ref 42–145)
Saturation Ratios: 3.9 % — ABNORMAL LOW (ref 20.0–50.0)
Transferrin: 364 mg/dL — ABNORMAL HIGH (ref 212.0–360.0)

## 2020-06-10 NOTE — Progress Notes (Signed)
[place] VISIT   Primary Care Provider Revelo, Elyse Jarvis, MD [address] [place] [address] [phone]  Referring Provider Revelo, Elyse Jarvis, MD [address] [place],  [place] [address] [phone]  Patient Profile: Carrie Bryant is a 69 y.o. female with a pmh significant for hypertension, hyperlipidemia, asthma, nephrolithiasis, status post hysterectomy, colon polyps.  The patient presents to the [facility] for an evaluation and management of problem(s) noted below:  Problem List 1. Abnormal colonoscopy   2. Rectal polyp   3. History of colon polyps     History of Present Illness This is the patient's first visit to the outpatient [place] clinic.  She recently underwent her first screening colonoscopy in November 2021 by Dr. Bonna Gains.  He was found to have multiple adenomatous colon polyps.  A large rectal/rectosigmoid polyp that returned as adenomatous on pathology.  It is for this reason that the patient is referred for consideration of resection.  The patient is accompanied by her daughter this afternoon who helps with interpretation per the patient request.  Patient describes no other significant GI symptoms at this time.  She describes normal bowel movements on a once to twice daily basis.  She describes no blood in her stools.  There is no family history of colon cancer or other GI malignancies.  The patient does take celecoxib but no other significant nonsteroidals or BC/Goody powders.  She has never had an upper endoscopy.  GI Review of Systems Positive as above Negative for pyrosis, dysphagia, odynophagia, nausea, vomiting, pain, change in bowel habits  Review of Systems General: Denies fevers/chills/weight loss unintentionally HEENT: Denies oral lesions Cardiovascular: Denies chest pain Pulmonary: Denies shortness of breath Gastroenterological: See HPI Genitourinary: Denies  darkened urine Hematological: Denies easy bruising/bleeding Dermatological: Denies jaundice Psychological: Mood is stable but she is very anxious about the procedure as well as the concern for having underlying cancer   Medications Current Outpatient Medications  Medication Sig Dispense Refill  . albuterol (PROVENTIL) (2.5 MG/3ML) 0.083% nebulizer solution Take 2.5 mg by nebulization every 6 (six) hours as needed for wheezing or shortness of breath.    Marland Kitchen albuterol (VENTOLIN HFA) 108 (90 Base) MCG/ACT inhaler Inhale 2 puffs into the lungs every 4 (four) hours as needed for wheezing or shortness of breath. 18 g 0  . celecoxib (CELEBREX) 100 MG capsule     . clonazePAM (KLONOPIN) 2 MG tablet Take 2 mg by mouth 2 (two) times daily.    . Cyanocobalamin (VITAMIN B12 PO) Take by mouth daily.    Marland Kitchen escitalopram (LEXAPRO) 20 MG tablet Take 20 mg by mouth daily.    . furosemide (LASIX) 20 MG tablet     . ID NOW COVID-19 KIT See admin instructions.    Marland Kitchen levothyroxine (SYNTHROID) 25 MCG tablet     . montelukast (SINGULAIR) 10 MG tablet Take 10 mg by mouth daily.    . simvastatin (ZOCOR) 20 MG tablet Take 20 mg by mouth daily.    . traZODone (DESYREL) 150 MG tablet Take by mouth at bedtime.    . valsartan (DIOVAN) 80 MG tablet     . VITAMIN D PO Take by mouth.     No current facility-administered medications for this visit.    Allergies Allergies  Allergen Reactions  . Morphine And Related Anaphylaxis  . Codeine   . Morphine Other (See Comments)    Histories Past Medical History:  Diagnosis Date  . Arthritis   .  Asthma   . History of kidney stones   . Hypercholesterolemia   . Hypertension   . Thyroid disease    Past Surgical History:  Procedure Laterality Date  . ABDOMINAL HYSTERECTOMY    . COLONOSCOPY WITH PROPOFOL N/A 05/02/2020   Procedure: COLONOSCOPY WITH BIOPSY;  Surgeon: Virgel Manifold, MD;  Location: [facility];  Service: Endoscopy;  Laterality: N/A;  PRIORITY  4  . POLYPECTOMY N/A 05/02/2020   Procedure: POLYPECTOMY;  Surgeon: Virgel Manifold, MD;  Location: [facility];  Service: Endoscopy;  Laterality: N/A;   Social History   Socioeconomic History  . Marital status: Married    Spouse name: Not on file  . Number of children: Not on file  . Years of education: Not on file  . Highest education level: Not on file  Occupational History  . Not on file  Tobacco Use  . Smoking status: Former Smoker    Types: Cigarettes    Quit date: 1990    Years since quitting: 31.9  . Smokeless tobacco: Never Used  . Tobacco comment: Social smoker "long ago"  Media planner  . Vaping Use: Never used  Substance and Sexual Activity  . Alcohol use: Never  . Drug use: Never  . Sexual activity: Not on file  Other Topics Concern  . Not on file  Social History Narrative  . Not on file   Social Determinants of Health   Financial Resource Strain: Not on file  Food Insecurity: Not on file  Transportation Needs: Not on file  Physical Activity: Not on file  Stress: Not on file  Social Connections: Not on file  Intimate Partner Violence: Not on file   Family History  Problem Relation Age of Onset  . Colon cancer Neg Hx   . Esophageal cancer Neg Hx   . Inflammatory bowel disease Neg Hx   . Liver disease Neg Hx   . Pancreatic cancer Neg Hx   . Rectal cancer Neg Hx   . Stomach cancer Neg Hx    I have reviewed her medical, social, and family history in detail and updated the electronic medical record as necessary.    PHYSICAL EXAMINATION  BP 120/72   Pulse 72   Ht _0  (1.473 m)   Wt 151 lb 6 oz (68.7 kg)   SpO2 96%   BMI 31.64 kg/m  Wt Readings from Last 3 Encounters:  06/09/20 151 lb 6 oz (68.7 kg)  05/02/20 146 lb (66.2 kg)  01/07/20 152 lb (68.9 kg)  GEN: NAD, appears stated age, doesn't appear chronically ill, accompanied by daughter PSYCH: Cooperative, without pressured speech EYE: Conjunctivae pink, sclerae anicteric ENT:  MMM CV: Nontachycardic RESP: No audible wheezing GI: NABS, soft, NT/ND, without rebound or guarding, no HSM appreciated MSK/EXT: Trace lower extremity edema present SKIN: No jaundice NEURO:  Alert & Oriented x 3, no focal deficits   REVIEW OF DATA  I reviewed the following data at the time of this encounter:  GI Procedures and Studies  November 2021 colonoscopy - One 15 mm polyp in the ascending colon, removed with a hot snare. Resected and retrieved. Injected. - Three 4 to 5 mm polyps in the transverse colon, removed with a cold biopsy forceps. Resected and retrieved. - Two 3 to 4 mm polyps in the sigmoid colon, removed with a cold biopsy forceps. Resected and retrieved. - One 25 mm, non-bleeding polyp in the sigmoid colon at 10 cm proximal to the anus. Biopsied. Tattooed. -  The examination was otherwise normal. - The rectum, sigmoid colon, descending colon, transverse colon, ascending colon and cecum are normal. - The distal rectum and anal verge are normal on retroflexion view.  Pathology DIAGNOSIS:  A. COLON POLYP, ASCENDING; HOT SNARE:  - TUBULAR ADENOMA.  - NEGATIVE FOR HIGH-GRADE DYSPLASIA AND MALIGNANCY.  Comment:  Determination of polyp base status is difficult secondary to specimen  fragmentation. However, cauterized tissue edges appear free of  dysplasia.  B. COLON, RANDOM; COLD BIOPSY:  - BENIGN COLONIC MUCOSA WITH NO SIGNIFICANT HISTOPATHOLOGIC CHANGE.  - NEGATIVE FOR FEATURES OF MICROSCOPIC COLITIS.  - NEGATIVE FOR DYSPLASIA AND MALIGNANCY.  C. COLON POLYPS X3, TRANSVERSE; COLD BIOPSY:  - FRAGMENTS (X2) OF TUBULAR ADENOMAS.  - SINGLE FRAGMENT OF EARLY SESSILE SERRATED POLYP.  - NEGATIVE FOR HIGH-GRADE DYSPLASIA AND MALIGNANCY.  D. COLON POLYPS X2, SIGMOID; COLD BIOPSY:  - FRAGMENTS (X2) OF HYPERPLASTIC POLYPS.  - NEGATIVE FOR DYSPLASIA AND MALIGNANCY.  E. COLON POLYP, SIGMOID AT 10 CM; COLD BIOPSY:  - SUPERFICIAL FRAGMENTS OF VILLOUS ADENOMA.  - NEGATIVE FOR  HIGH-GRADE DYSPLASIA AND MALIGNANCY IN THE CURRENT SAMPLING.  Comment:  Multiple additional deeper recut levels were examined. Sections show  superficial fragments of villi with low grade adenomatous dysplasia.  Given the endoscopic impression of a 25 mm polyp, this sampling may not be fully representative. Clinical and endoscopic correlation is  recommended.   Laboratory Studies  Reviewed those in epic CEA negative  Imaging Studies  November 2021 CT abdomen pelvis with contrast IMPRESSION: 1. No acute intra-abdominal or pelvic pathology. 2. Aortic Atherosclerosis (ICD10-I70.0).   ASSESSMENT  Carrie Bryant is a 69 y.o. female with a pmh significant for hypertension, hyperlipidemia, asthma, nephrolithiasis, status post hysterectomy, colon polyps.  The patient is seen today for evaluation and management of:  1. Abnormal colonoscopy   2. Rectal polyp   3. History of colon polyps    The patient is clinically and hemodynamically stable.  We discussed some of the techniques of advanced polypectomy which include Endoscopic Mucosal Resection, OVESCO Full-Thickness Resection, Endorotor Morcellation, and Tissue Ablation via Fulguration.  The risks and benefits of endoscopic evaluation were discussed with the patient; these include but are not limited to the risk of perforation, infection, bleeding, missed lesions, lack of diagnosis, severe illness requiring hospitalization, as well as anesthesia and sedation related illnesses.  During attempts at advanced polypectomy, the risks of bleeding and perforation are increased as opposed to diagnostic and screening colonoscopies, and that was discussed with the patient as well.  I did offer, a referral to surgery as well to discuss consideration of a hemicolectomy prior to attempt at endoscopic removal, though the patient has deferred on this.  If, after removal of the polyp, it is found that the patient has an invasive lesion or malignant lesion, it could  require a surgical referral as well.  The patient and family are agreeable to proceed.   PLAN  Preprocedure labs to be obtained Proceed with scheduling colonoscopy with EMR Further recommendations based on final results of colonoscopy   Orders Placed This Encounter  Procedures  . Procedural/ Surgical Case Request: COLONOSCOPY WITH PROPOFOL, ENDOSCOPIC MUCOSAL RESECTION  . CBC  . Basic Metabolic Panel (BMET)  . INR/PT  . Ambulatory referral to Gastroenterology    New Prescriptions   No medications on file   Modified Medications   No medications on file    Planned Follow Up No follow-ups on file.   Total Time in Face-to-Face and  in Coordination of Care for patient including independent/personal interpretation/review of prior testing, medical history, examination, medication adjustment, communicating results with the patient directly, and documentation with the EHR is 45 minutes.  Justice Britain, MD [facility] Advanced [facility]oscopy Office # [phone]

## 2020-06-14 ENCOUNTER — Telehealth: Payer: Self-pay | Admitting: Gastroenterology

## 2020-06-14 DIAGNOSIS — D649 Anemia, unspecified: Secondary | ICD-10-CM | POA: Insufficient documentation

## 2020-06-14 NOTE — Telephone Encounter (Signed)
Mansouraty, Telford Nab., MD  Timothy Lasso, RN Cc: Virgel Manifold, MD Dyshon Philbin or covering RN,  Please let the patient's daughter know that her laboratorie do show evidence of significant iron deficiency. Thankfully there was no evidence of anemia. She needs to be initiated on oral iron supplementation ferrous gluconate 324 mg daily or ferrous sulfate 325 mg daily (as a prescription). Because of the iron deficiency I would recommend that we go ahead and just do an upper endoscopy at the same time as her planned colonoscopy. No need for adjusting her time on the already scheduled date.  FYI Dr. Bonna Gains.  Thanks.  GM

## 2020-06-14 NOTE — Telephone Encounter (Signed)
Carrie Bryant, I need to get the dx code for the EGD.  (the ones that are there are only colonoscopy dx codes) She will need auths for both studies.  Also, I'm assuming when you added the EGD with the hospital, you gave them the dx code for the EGD.  If the codes don't match, preservice will bump the auth.  Thanks

## 2020-06-14 NOTE — Telephone Encounter (Signed)
Patient is returning your call.  

## 2020-06-14 NOTE — Telephone Encounter (Signed)
D64.9 yes, I did give the info to the hospital.

## 2020-06-14 NOTE — Telephone Encounter (Signed)
The patient has been notified of this information and all questions answered. The pt will begin iron supplement and agrees to EGD.  (already added) Amy I have added EGD for anemia to the pt procedures.

## 2020-06-14 NOTE — Telephone Encounter (Signed)
Ok, thank you so much!

## 2020-06-30 ENCOUNTER — Ambulatory Visit: Payer: Medicare HMO | Admitting: Gastroenterology

## 2020-07-14 ENCOUNTER — Other Ambulatory Visit: Payer: Self-pay

## 2020-07-14 ENCOUNTER — Inpatient Hospital Stay: Admission: RE | Admit: 2020-07-14 | Payer: Medicare HMO | Source: Ambulatory Visit

## 2020-07-14 ENCOUNTER — Other Ambulatory Visit
Admission: RE | Admit: 2020-07-14 | Discharge: 2020-07-14 | Disposition: A | Discharge status: Home / Self Care | Payer: Medicare HMO | Source: Ambulatory Visit | Attending: Gastroenterology | Admitting: Gastroenterology

## 2020-07-14 DIAGNOSIS — Z20822 Contact with and (suspected) exposure to covid-19: Secondary | ICD-10-CM | POA: Insufficient documentation

## 2020-07-14 DIAGNOSIS — Z01812 Encounter for preprocedural laboratory examination: Secondary | ICD-10-CM | POA: Insufficient documentation

## 2020-07-15 ENCOUNTER — Telehealth: Payer: Self-pay

## 2020-07-15 ENCOUNTER — Encounter (HOSPITAL_COMMUNITY): Payer: Self-pay | Admitting: Gastroenterology

## 2020-07-15 LAB — SARS CORONAVIRUS 2 (TAT 6-24 HRS): SARS Coronavirus 2: NEGATIVE

## 2020-07-15 NOTE — Telephone Encounter (Signed)
Tried to reach the pt to discuss case for Monday in the event we have snow. Per Dr Rush Landmark he will be at the hospital for his cases and the patients may remain as planned.    Left message on machine to call back

## 2020-07-15 NOTE — Progress Notes (Signed)
Spoke with pt's daughter, Demetrius Revel after getting permission from patient. Spoke with her via [employer] Julio ID # [id number]. Demetrius Revel states pt saw a cardiologist when she lived in [place] but now sees only a PCP, Dr. Jenelle Mages Revelo in [place]. She states pt has not had a heart attack, irregular heart rate or heart murmur. 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 Pt denies any recent chest pain or sob. Pt is not diabetic.  EKG - none in Epic or Care Everywhere CXR - 01/07/20  Covid test done 07/14/20 and it's negative. Daughter states patient has been in quarantine since the test was done and understands that she stays in quarantine until she comes to the hospital on Monday.

## 2020-07-18 ENCOUNTER — Ambulatory Visit (HOSPITAL_COMMUNITY)
Admission: RE | Admit: 2020-07-18 | Discharge: 2020-07-18 | Disposition: A | Discharge status: Home / Self Care | Payer: Medicare HMO | Attending: Gastroenterology | Admitting: Gastroenterology

## 2020-07-18 ENCOUNTER — Other Ambulatory Visit: Payer: Self-pay

## 2020-07-18 ENCOUNTER — Encounter (HOSPITAL_COMMUNITY)
Admission: RE | Disposition: A | Discharge status: Home / Self Care | Payer: Self-pay | Source: Home / Self Care | Attending: Gastroenterology

## 2020-07-18 ENCOUNTER — Encounter (HOSPITAL_COMMUNITY): Payer: Self-pay | Admitting: Gastroenterology

## 2020-07-18 ENCOUNTER — Ambulatory Visit (HOSPITAL_COMMUNITY): Payer: Medicare HMO | Admitting: Certified Registered Nurse Anesthetist

## 2020-07-18 DIAGNOSIS — D122 Benign neoplasm of ascending colon: Secondary | ICD-10-CM

## 2020-07-18 DIAGNOSIS — D124 Benign neoplasm of descending colon: Secondary | ICD-10-CM | POA: Diagnosis not present

## 2020-07-18 DIAGNOSIS — Z87891 Personal history of nicotine dependence: Secondary | ICD-10-CM | POA: Insufficient documentation

## 2020-07-18 DIAGNOSIS — D127 Benign neoplasm of rectosigmoid junction: Secondary | ICD-10-CM

## 2020-07-18 DIAGNOSIS — K621 Rectal polyp: Secondary | ICD-10-CM

## 2020-07-18 DIAGNOSIS — K641 Second degree hemorrhoids: Secondary | ICD-10-CM | POA: Insufficient documentation

## 2020-07-18 DIAGNOSIS — D509 Iron deficiency anemia, unspecified: Secondary | ICD-10-CM | POA: Insufficient documentation

## 2020-07-18 DIAGNOSIS — D126 Benign neoplasm of colon, unspecified: Secondary | ICD-10-CM | POA: Insufficient documentation

## 2020-07-18 DIAGNOSIS — D123 Benign neoplasm of transverse colon: Secondary | ICD-10-CM

## 2020-07-18 DIAGNOSIS — K2950 Unspecified chronic gastritis without bleeding: Secondary | ICD-10-CM | POA: Insufficient documentation

## 2020-07-18 DIAGNOSIS — R933 Abnormal findings on diagnostic imaging of other parts of digestive tract: Secondary | ICD-10-CM

## 2020-07-18 DIAGNOSIS — D375 Neoplasm of uncertain behavior of rectum: Secondary | ICD-10-CM | POA: Diagnosis not present

## 2020-07-18 DIAGNOSIS — Z86010 Personal history of colonic polyps: Secondary | ICD-10-CM

## 2020-07-18 DIAGNOSIS — K449 Diaphragmatic hernia without obstruction or gangrene: Secondary | ICD-10-CM | POA: Insufficient documentation

## 2020-07-18 DIAGNOSIS — K3189 Other diseases of stomach and duodenum: Secondary | ICD-10-CM | POA: Diagnosis not present

## 2020-07-18 HISTORY — PX: SUBMUCOSAL LIFTING INJECTION: SHX6855

## 2020-07-18 HISTORY — DX: Depression, unspecified: F32.A

## 2020-07-18 HISTORY — PX: HEMOSTASIS CLIP PLACEMENT: SHX6857

## 2020-07-18 HISTORY — PX: ENDOSCOPIC MUCOSAL RESECTION: SHX6839

## 2020-07-18 HISTORY — PX: BIOPSY: SHX5522

## 2020-07-18 HISTORY — PX: COLONOSCOPY WITH PROPOFOL: SHX5780

## 2020-07-18 HISTORY — DX: Anemia, unspecified: D64.9

## 2020-07-18 HISTORY — DX: Anxiety disorder, unspecified: F41.9

## 2020-07-18 HISTORY — PX: ESOPHAGOGASTRODUODENOSCOPY (EGD) WITH PROPOFOL: SHX5813

## 2020-07-18 HISTORY — PX: POLYPECTOMY: SHX5525

## 2020-07-18 SURGERY — COLONOSCOPY WITH PROPOFOL
Anesthesia: General

## 2020-07-18 MED ORDER — SODIUM CHLORIDE 0.9 % IV SOLN: INTRAVENOUS | Status: DC

## 2020-07-18 MED ORDER — PHENYLEPHRINE HCL-NACL 10-0.9 MG/250ML-% IV SOLN
INTRAVENOUS | Status: DC | PRN
  Administered 2020-07-18: 20 ug/min via INTRAVENOUS

## 2020-07-18 MED ORDER — LIDOCAINE 2% (20 MG/ML) 5 ML SYRINGE
INTRAMUSCULAR | Status: DC | PRN
  Administered 2020-07-18: 30 mg via INTRAVENOUS
  Administered 2020-07-18: 60 mg via INTRAVENOUS

## 2020-07-18 MED ORDER — EPHEDRINE SULFATE-NACL 50-0.9 MG/10ML-% IV SOSY
PREFILLED_SYRINGE | INTRAVENOUS | Status: DC | PRN
  Administered 2020-07-18: 10 mg via INTRAVENOUS

## 2020-07-18 MED ORDER — PROPOFOL 500 MG/50ML IV EMUL
INTRAVENOUS | Status: DC | PRN
  Administered 2020-07-18: 125 ug/kg/min via INTRAVENOUS
  Administered 2020-07-18: 150 ug/kg/min via INTRAVENOUS
  Administered 2020-07-18: 125 ug/kg/min via INTRAVENOUS

## 2020-07-18 MED ORDER — LACTATED RINGERS IV SOLN: INTRAVENOUS | Status: DC

## 2020-07-18 MED ORDER — PROPOFOL 10 MG/ML IV BOLUS
INTRAVENOUS | Status: DC | PRN
  Administered 2020-07-18: 30 mg via INTRAVENOUS
  Administered 2020-07-18: 20 mg via INTRAVENOUS

## 2020-07-18 SURGICAL SUPPLY — 24 items

## 2020-07-18 NOTE — Anesthesia Procedure Notes (Signed)
Procedure Name: MAC Date/Time: 07/18/2020 10:25 AM Performed by: Reece Agar, CRNA Pre-anesthesia Checklist: Patient identified, Emergency Drugs available, Suction available, Patient being monitored and Timeout performed Oxygen Delivery Method: Nasal cannula

## 2020-07-18 NOTE — Anesthesia Preprocedure Evaluation (Addendum)
Anesthesia Evaluation  Patient identified by MRN, date of birth, ID band Patient awake    Reviewed: Allergy & Precautions, NPO status , Patient's Chart, lab work & pertinent test results  Airway Mallampati: II  TM Distance: >3 FB Neck ROM: Full    Dental  (+) Edentulous Upper, Edentulous Lower   Pulmonary asthma , former smoker,    Pulmonary exam normal        Cardiovascular hypertension, Pt. on medications Normal cardiovascular exam     Neuro/Psych negative neurological ROS  negative psych ROS   GI/Hepatic negative GI ROS, Neg liver ROS,   Endo/Other  Hypothyroidism BMI 30  Renal/GU negative Renal ROS     Musculoskeletal  (+) Arthritis , Osteoarthritis,    Abdominal (+) + obese,   Peds  Hematology negative hematology ROS (+)   Anesthesia Other Findings   Reproductive/Obstetrics                            Anesthesia Physical  Anesthesia Plan  ASA: II  Anesthesia Plan: General   Post-op Pain Management:    Induction: Intravenous  PONV Risk Score and Plan: 3 and Propofol infusion, TIVA and Treatment may vary due to age or medical condition  Airway Management Planned: Natural Airway and Nasal Cannula  Additional Equipment: None  Intra-op Plan:   Post-operative Plan:   Informed Consent: I have reviewed the patients History and Physical, chart, labs and discussed the procedure including the risks, benefits and alternatives for the proposed anesthesia with the patient or authorized representative who has indicated his/her understanding and acceptance.     Dental advisory given and Consent reviewed with POA  Plan Discussed with: CRNA  Anesthesia Plan Comments:         Anesthesia Quick Evaluation

## 2020-07-18 NOTE — Op Note (Signed)
[facility] Patient Name: Carrie Bryant Procedure Date : 07/18/2020 MRN: [id number] Attending MD: Justice Britain , MD Date of Birth: 11/30/1950 CSN: [id number] Age: 70 Admit Type: Outpatient Procedure:                Upper GI endoscopy Indications:              Iron deficiency anemia Providers:                Justice Britain, MD, Jeanella Cara, RN,                            William Dalton, Technician, Nelia Shi, RN Referring MD:             Elyse Jarvis Revelo, Lennette Bihari Bonna Gains MD, MD Medicines:                Monitored Anesthesia Care Complications:            No immediate complications. Estimated Blood Loss:     Estimated blood loss was minimal. Procedure:                Pre-Anesthesia Assessment:                           - Prior to the procedure, a History and Physical                            was performed, and patient medications and                            allergies were reviewed. The patient's tolerance of                            previous anesthesia was also reviewed. The risks                            and benefits of the procedure and the sedation                            options and risks were discussed with the patient.                            All questions were answered, and informed consent                            was obtained. Prior Anticoagulants: The patient has                            taken no previous anticoagulant or antiplatelet                            agents except for NSAID medication. ASA Grade                            Assessment: III - A patient with severe systemic  disease. After reviewing the risks and benefits,                            the patient was deemed in satisfactory condition to                            undergo the procedure.                           After obtaining informed consent, the endoscope was                            passed under  direct vision. Throughout the                            procedure, the patient's blood pressure, pulse, and                            oxygen saturations were monitored continuously. The                            [id number]-[id number] ([id number]) Olympus gastroscope was                            introduced through the mouth, and advanced to the                            second part of duodenum. The upper GI endoscopy was                            accomplished without difficulty. The patient                            tolerated the procedure. Scope In: Scope Out: Findings:      No gross lesions were noted in the entire esophagus.      The Z-line was irregular and was found 32 cm from the incisors.      A 2 cm hiatal hernia was present.      Striped mildly erythematous mucosa without bleeding was found in the       gastric antrum.      No other gross lesions were noted in the entire examined stomach.       Biopsies were taken with a cold forceps for histology and Helicobacter       pylori testing.      No gross lesions were noted in the duodenal bulb, in the first portion       of the duodenum and in the second portion of the duodenum. Biopsies for       histology were taken with a cold forceps for evaluation of celiac       disease. Impression:               - No gross lesions in esophagus. Z-line irregular,                            32 cm from the incisors.                           -  2 cm hiatal hernia. Erythematous mucosa in the                            antrum. No other gross lesions in the stomach.                            Biopsied for HP.                           - No gross lesions in the duodenal bulb, in the                            first portion of the duodenum and in the second                            portion of the duodenum. Biopsied. Recommendation:           - Proceed to scheduled colonoscopy.                           - Observe patient's clinical course.                            - Await pathology results.                           - If evidence of gastritis is noted consider                            initiation of PPI therapy.                           - The findings and recommendations were discussed                            with the patient.                           - The findings and recommendations were discussed                            with the patient's family. Procedure Code(s):        --- Professional ---                           [phone], Esophagogastroduodenoscopy, flexible,                            transoral; with biopsy, single or multiple Diagnosis Code(s):        --- Professional ---                           K44.9, Diaphragmatic hernia without obstruction or                            gangrene  K31.89, Other diseases of stomach and duodenum                           D50.9, Iron deficiency anemia, unspecified CPT copyright 2019 [employer] Medical [employer]. All rights reserved. The codes documented in this report are preliminary and upon coder review may  be revised to meet current compliance requirements. Justice Britain, MD 07/18/2020 12:46:02 PM Number of Addenda: 0

## 2020-07-18 NOTE — H&P (Addendum)
GASTROENTEROLOGY PROCEDURE H&P NOTE   Primary Care Physician: Theotis Burrow, MD  HPI: Carrie Bryant is a 70 y.o. female who presents for Colonoscopy with attempt at EMR of R/S adenoma with HGD.  EGD added due to iron deficiency anemia.  Past Medical History:  Diagnosis Date  . Anemia   . Anxiety   . Arthritis   . Asthma   . Depression   . History of kidney stones   . Hypercholesterolemia   . Hypertension   . Thyroid disease    Past Surgical History:  Procedure Laterality Date  . ABDOMINAL HYSTERECTOMY    . COLONOSCOPY WITH PROPOFOL N/A 05/02/2020   Procedure: COLONOSCOPY WITH BIOPSY;  Surgeon: Virgel Manifold, MD;  Location: [place] [facility];  Service: Endoscopy;  Laterality: N/A;  PRIORITY 4  . POLYPECTOMY N/A 05/02/2020   Procedure: POLYPECTOMY;  Surgeon: Virgel Manifold, MD;  Location: [place];  Service: Endoscopy;  Laterality: N/A;   Current Facility-Administered Medications  Medication Dose Route Frequency Provider Last Rate Last Admin  . 0.9 %  sodium chloride infusion   Intravenous Continuous Mansouraty, Telford Nab., MD      . lactated ringers infusion   Intravenous Continuous Mansouraty, Telford Nab., MD 20 mL/hr at 07/18/20 0917 New Bag at 07/18/20 2952   Allergies  Allergen Reactions  . Morphine And Related Anaphylaxis and Other (See Comments)    dizziness  . Codeine Anxiety and Other (See Comments)    Hypertension (elevates BP) & chest pain.   Family History  Problem Relation Age of Onset  . Colon cancer Neg Hx   . Esophageal cancer Neg Hx   . Inflammatory bowel disease Neg Hx   . Liver disease Neg Hx   . Pancreatic cancer Neg Hx   . Rectal cancer Neg Hx   . Stomach cancer Neg Hx    Social History   Socioeconomic History  . Marital status: Married    Spouse name: Not on file  . Number of children: Not on file  . Years of education: Not on file  . Highest education level: Not on file  Occupational History  .  Not on file  Tobacco Use  . Smoking status: Former Smoker    Types: Cigarettes    Quit date: 1990    Years since quitting: 32.0  . Smokeless tobacco: Never Used  . Tobacco comment: Social smoker "long ago"  Media planner  . Vaping Use: Never used  Substance and Sexual Activity  . Alcohol use: Never  . Drug use: Never  . Sexual activity: Not on file  Other Topics Concern  . Not on file  Social History Narrative  . Not on file   Social Determinants of Health   Financial Resource Strain: Not on file  Food Insecurity: Not on file  Transportation Needs: Not on file  Physical Activity: Not on file  Stress: Not on file  Social Connections: Not on file  Intimate Partner Violence: Not on file    Physical Exam: Vital signs in last 24 hours: Temp:  [97.6 F (36.4 C)] 97.6 F (36.4 C) (01/17 0856) Pulse Rate:  [59] 59 (01/17 0856) Resp:  [17] 17 (01/17 0856) BP: (125)/(73) 125/73 (01/17 0856) SpO2:  [96 %] 96 % (01/17 0856) Weight:  [67.6 kg] 67.6 kg (01/17 0856)   GEN: NAD EYE: Sclerae anicteric ENT: MMM CV: Non-tachycardic GI: Soft, NT/ND NEURO:  Alert & Oriented x 3  Lab Results: No results for input(s):  WBC, HGB, HCT, PLT in the last 72 hours. BMET No results for input(s): NA, K, CL, CO2, GLUCOSE, BUN, CREATININE, CALCIUM in the last 72 hours. LFT No results for input(s): PROT, ALBUMIN, AST, ALT, ALKPHOS, BILITOT, BILIDIR, IBILI in the last 72 hours. PT/INR No results for input(s): LABPROT, INR in the last 72 hours.   Impression / Plan: This is a 70 y.o.female  who presents for Colonoscopy with attempt at EMR of R/S adenoma with HGD.  EGD added due to iron deficiency anemia.  The risks and benefits of endoscopic evaluation were discussed with the patient; these include but are not limited to the risk of perforation, infection, bleeding, missed lesions, lack of diagnosis, severe illness requiring hospitalization, as well as anesthesia and sedation related illnesses.   The patient is agreeable to proceed.    Justice Britain, MD [facility] Office # [phone]

## 2020-07-18 NOTE — Transfer of Care (Signed)
Immediate Anesthesia Transfer of Care Note  Patient: Carrie Bryant  Procedure(s) Performed: COLONOSCOPY WITH PROPOFOL (N/A ) ENDOSCOPIC MUCOSAL RESECTION (N/A ) ESOPHAGOGASTRODUODENOSCOPY (EGD) WITH PROPOFOL (N/A ) BIOPSY POLYPECTOMY SUBMUCOSAL LIFTING INJECTION HEMOSTASIS CLIP PLACEMENT  Patient Location: PACU  Anesthesia Type:MAC  Level of Consciousness: awake and alert   Airway & Oxygen Therapy: Patient Spontanous Breathing  Post-op Assessment: Report given to RN and Post -op Vital signs reviewed and stable  Post vital signs: Reviewed and stable  Last Vitals:  Vitals Value Taken Time  BP 105/63 07/18/20 1241  Temp 36.2 C 07/18/20 1241  Pulse 68 07/18/20 1246  Resp 14 07/18/20 1246  SpO2 96 % 07/18/20 1246  Vitals shown include unvalidated device data.  Last Pain:  Vitals:   07/18/20 0856  TempSrc: Oral  PainSc: 4          Complications: No complications documented.

## 2020-07-18 NOTE — Anesthesia Postprocedure Evaluation (Signed)
Anesthesia Post Note  Patient: Carrie Bryant  Procedure(s) Performed: COLONOSCOPY WITH PROPOFOL (N/A ) ENDOSCOPIC MUCOSAL RESECTION (N/A ) ESOPHAGOGASTRODUODENOSCOPY (EGD) WITH PROPOFOL (N/A ) BIOPSY POLYPECTOMY SUBMUCOSAL LIFTING INJECTION HEMOSTASIS CLIP PLACEMENT     Patient location during evaluation: Endoscopy Anesthesia Type: MAC Level of consciousness: awake and alert Pain management: pain level controlled Vital Signs Assessment: post-procedure vital signs reviewed and stable Respiratory status: spontaneous breathing and respiratory function stable Cardiovascular status: stable Postop Assessment: no apparent nausea or vomiting Anesthetic complications: no   No complications documented.  Last Vitals:  Vitals:   07/18/20 1300 07/18/20 1315  BP: (!) 115/98 133/69  Pulse: 66 65  Resp: 19 16  Temp:  36.4 C  SpO2: 98% 96%    Last Pain:  Vitals:   07/18/20 1315  TempSrc:   PainSc: 0-No pain                 Zekiah Caruth DANIEL

## 2020-07-18 NOTE — Op Note (Signed)
[facility] Patient Name: Carrie Bryant Procedure Date : 07/18/2020 MRN: [id number] Attending MD: Justice Britain , MD Date of Birth: 23-Mar-1951 CSN: [id number] Age: 70 Admit Type: Outpatient Procedure:                Colonoscopy Indications:              Excision of colonic polyp (noted in Rectosigmoid                            colon with HGD noted) Providers:                Justice Britain, MD, Jeanella Cara, RN,                            William Dalton, Technician, Nelia Shi, RN Referring MD:             Elyse Jarvis Revelo, Lennette Bihari. Bonna Gains MD, MD Medicines:                Monitored Anesthesia Care Complications:            No immediate complications. Estimated Blood Loss:     Estimated blood loss was minimal. Procedure:                Pre-Anesthesia Assessment:                           - Prior to the procedure, a History and Physical                            was performed, and patient medications and                            allergies were reviewed. The patient's tolerance of                            previous anesthesia was also reviewed. The risks                            and benefits of the procedure and the sedation                            options and risks were discussed with the patient.                            All questions were answered, and informed consent                            was obtained. Prior Anticoagulants: The patient has                            taken no previous anticoagulant or antiplatelet                            agents except for NSAID medication. ASA Grade  Assessment: III - A patient with severe systemic                            disease. After reviewing the risks and benefits,                            the patient was deemed in satisfactory condition to                            undergo the procedure.                           After obtaining informed  consent, the colonoscope                            was passed under direct vision. Throughout the                            procedure, the patient's blood pressure, pulse, and                            oxygen saturations were monitored continuously. The                            PCF-H190DL ([id number]) Olympus pediatric colonoscope                            was introduced through the anus and advanced to the                            5 cm into the ileum. The colonoscopy was                            technically difficult and complex. Successful                            completion of the procedure was aided by performing                            the maneuvers documented (below) in this report.                            The patient tolerated the procedure. The quality of                            the bowel preparation was good. The terminal ileum,                            ileocecal valve, appendiceal orifice, and rectum                            were photographed. Scope In: 10:54:48 AM Scope Out: 12:32:21 PM Scope Withdrawal Time: 1 hour 33 minutes 38 seconds  Total Procedure Duration: 1 hour  37 minutes 33 seconds  Findings:      The digital rectal exam findings include palpable rectal nodularity and       hemorrhoids.      The terminal ileum and ileocecal valve appeared normal.      Six sessile polyps were found in the recto-sigmoid colon (1), sigmoid       colon (1), descending colon (1), transverse colon (1), ascending colon       (1), and cecum (1). The polyps were 2 to 5 mm in size. These polyps were       removed with a cold snare. Resection and retrieval were complete.      A 40 mm polyp was found in the mid rectum (8-11 cm). The polyp was mixed       and lateral spreading with JNET 1 and JNET2 qualities on endoscopic       evaluation with a larger nodule present. Preparations were made for       mucosal resection. NBI imaging and White-light endoscopy was done to        demarcate the borders of the lesion. Orise gel was injected to raise the       lesion. Piecemeal mucosal resection using multiple snares was performed.       Resection and retrieval were complete. During the procedure, there were       some small areas of oozing noted from the resection bed, coagulation for       hemostasis using snare tip was successful. In order to optimize and       decrease risk of recurrence, coagulation for tissue destruction using       snare with STSC settings was performed and was successful. To prevent       bleeding after mucosal resection, seven hemostatic clips were       successfully placed (MR conditional). There was no bleeding at the end       of the procedure.      Non-bleeding non-thrombosed external and internal hemorrhoids were found       during retroflexion, during perianal exam and during digital exam. The       hemorrhoids were Grade II (internal hemorrhoids that prolapse but reduce       spontaneously). Impression:               - Palpable rectal nodularity and hemorrhoids found                            on digital rectal exam.                           - The examined portion of the ileum was normal.                           - Six 2 to 5 mm polyps at the recto-sigmoid colon,                            in the sigmoid colon, in the descending colon, in                            the transverse colon, in the ascending colon and in  the cecum, removed with a cold snare. Resected and                            retrieved.                           - One 40 mm mixed-lateral spreading JNET1 & JNET2                            appearing polyp in the mid rectum (8-11 cm),                            removed with piecemeal mucosal resection. Resected                            and retrieved. Treated with a hot snare tip to                            areas of oozing. STSC performed to the margin.                            Clips (MR  conditional) were placed.                           - Non-bleeding non-thrombosed external and internal                            hemorrhoids. Recommendation:           - The patient will be observed post-procedure,                            until all discharge criteria are met.                           - Discharge patient to home.                           - Patient has a contact number available for                            emergencies. The signs and symptoms of potential                            delayed complications were discussed with the                            patient. Return to normal activities tomorrow.                            Written discharge instructions were provided to the                            patient.                           -  High fiber diet.                           - No aspirin, ibuprofen, naproxen, or other                            non-steroidal anti-inflammatory drugs for 2 weeks                            after polyp removal.                           - Continue present medications otherwise.                           - Await pathology results.                           - Repeat colonoscopy in 6 months for surveillance                            after piecemeal polypectomy.                           - The findings and recommendations were discussed                            with the patient.                           - The findings and recommendations were discussed                            with the patient's family. Procedure Code(s):        --- Professional ---                           [phone], Colonoscopy, flexible; with endoscopic                            mucosal resection                           45385, 31, Colonoscopy, flexible; with removal of                            tumor(s), polyp(s), or other lesion(s) by snare                            technique Diagnosis Code(s):        --- Professional ---                            K62.89, Other specified diseases of anus and rectum                           K64.1, Second degree hemorrhoids  K63.5, Polyp of colon                           K62.1, Rectal polyp CPT copyright 2019 American Medical Association. All rights reserved. The codes documented in this report are preliminary and upon coder review may  be revised to meet current compliance requirements. Justice Britain, MD 07/18/2020 12:57:18 PM Number of Addenda: 0

## 2020-07-19 ENCOUNTER — Other Ambulatory Visit: Payer: Self-pay | Admitting: Physician Assistant

## 2020-07-20 ENCOUNTER — Encounter (HOSPITAL_COMMUNITY): Payer: Self-pay | Admitting: Gastroenterology

## 2020-07-21 LAB — SURGICAL PATHOLOGY

## 2020-07-26 ENCOUNTER — Telehealth: Payer: Self-pay | Admitting: Gastroenterology

## 2020-07-26 NOTE — Telephone Encounter (Signed)
Patient is having rectal bleed (dark colored)please call patient. STAT  Advised patient to go to the ED if severe.

## 2020-07-28 ENCOUNTER — Telehealth: Payer: Self-pay

## 2020-07-28 DIAGNOSIS — K625 Hemorrhage of anus and rectum: Secondary | ICD-10-CM

## 2020-07-28 NOTE — Telephone Encounter (Signed)
Called patient and asked how she was doing. Patient stated that she noticed some blood coming from her rectum. I then asked if she was still having some blood from her rectum and she stated that she was but that it was less. Therefore, I asked her to go to the Medical mall today or tomorrow and have her hemoglobin checked. I told her that if it was low, than Dr. Bonna Gains would recommend for her to go to the emergency room. Patient understood and had no further questions.

## 2020-08-01 ENCOUNTER — Encounter: Payer: Self-pay | Admitting: Gastroenterology

## 2020-08-04 ENCOUNTER — Other Ambulatory Visit: Payer: Self-pay

## 2020-08-04 ENCOUNTER — Encounter: Payer: Self-pay | Admitting: Gastroenterology

## 2020-08-04 ENCOUNTER — Ambulatory Visit (INDEPENDENT_AMBULATORY_CARE_PROVIDER_SITE_OTHER): Payer: Medicare HMO | Admitting: Gastroenterology

## 2020-08-04 VITALS — BP 130/78 | HR 80 | Temp 97.8°F | Ht <= 58 in | Wt 150.0 lb

## 2020-08-04 DIAGNOSIS — D374 Neoplasm of uncertain behavior of colon: Secondary | ICD-10-CM | POA: Diagnosis not present

## 2020-08-04 DIAGNOSIS — K2940 Chronic atrophic gastritis without bleeding: Secondary | ICD-10-CM

## 2020-08-04 NOTE — Progress Notes (Signed)
Vonda Antigua, MD [address]  [place]  [place], [place] [address]  Main: [phone]  Fax: [phone]   Primary Care Physician: Theotis Burrow, MD   Chief Complaint  Patient presents with  . Constipation    HPI: Carrie Bryant is a 70 y.o. female who presents with her daughter, and was evaluated with Spanish interpreter present.  Patient with history of large multilobulated colon polyp that was recently removed via EMR by Dr. 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  Repeat colonoscopy is recommended in 6 to 9 months.  She also underwent upper endoscopy with Dr. Rush Landmark for iron deficiency anemia which showed hiatal hernia, gastric erythema.  Duodenal biopsies did not show celiac disease.  Gastric biopsies showed intestinal metaplasia, and changes of atrophic autoimmune gastritis.  Patient reported bright red blood per rectum that went on for a few days after her January 17 colonoscopy with Dr. Rush Landmark.  This has resolved over the last 2 to 3 days.  Current Outpatient Medications  Medication Sig Dispense Refill  . albuterol (VENTOLIN HFA) 108 (90 Base) MCG/ACT inhaler Inhale 2 puffs into the lungs every 4 (four) hours as needed for wheezing or shortness of breath. 18 g 0  . celecoxib (CELEBREX) 100 MG capsule Take 100 mg by mouth 2 (two) times daily as needed (pain.).    Marland Kitchen clonazePAM (KLONOPIN) 2 MG tablet Take 2 mg by mouth at bedtime.    Marland Kitchen escitalopram (LEXAPRO) 20 MG tablet Take 20 mg by mouth daily.    . ferrous sulfate 325 (65 FE) MG tablet Take 325 mg by mouth daily.    . furosemide (LASIX) 20 MG tablet     . levothyroxine (SYNTHROID) 25 MCG tablet Take 25 mcg by mouth daily before breakfast.    . montelukast (SINGULAIR) 10 MG tablet Take 10 mg by mouth at bedtime.    . simvastatin (ZOCOR) 20 MG tablet Take 20 mg by mouth every evening.    . traZODone (DESYREL) 150 MG tablet Take 150 mg by mouth at bedtime.     . valsartan (DIOVAN) 80 MG tablet Take 80 mg by mouth daily.    . vitamin B-12 (CYANOCOBALAMIN) 1000 MCG tablet Take 1,000 mcg by mouth daily.    . Calcium Carb-Cholecalciferol (CALCIUM + D3 PO) Take 1 tablet by mouth every evening. (Patient not taking: Reported on 08/04/2020)     No current facility-administered medications for this visit.    Allergies as of 08/04/2020 - Review Complete 08/04/2020  Allergen Reaction Noted  . Morphine and related Anaphylaxis and Other (See Comments) 01/07/2020  . Codeine Anxiety and Other (See Comments) 06/09/2018    ROS:  General: Negative for anorexia, weight loss, fever, chills, fatigue, weakness. ENT: Negative for hoarseness, difficulty swallowing , nasal congestion. CV: Negative for chest pain, angina, palpitations, dyspnea on exertion, peripheral edema.  Respiratory: Negative for dyspnea at rest, dyspnea on exertion, cough, sputum, wheezing.  GI: See history of present illness. GU:  Negative for dysuria, hematuria, urinary incontinence, urinary frequency, nocturnal urination.  Endo: Negative for unusual weight change.    Physical Examination:   BP 130/78   Pulse 80   Temp 97.8 F (36.6 C) (Oral)   Ht 4\' 10"  (1.473 m)   Wt 150 lb (68 kg)   BMI 31.35 kg/m   General: Well-nourished, well-developed in no acute distress.  Eyes: No icterus. Conjunctivae pink. Mouth: Oropharyngeal mucosa moist and pink , no lesions erythema or  exudate. Neck: Supple, Trachea midline Abdomen: Bowel sounds are normal, nontender, nondistended, no hepatosplenomegaly or masses, no abdominal bruits or hernia , no rebound or guarding.   Extremities: No lower extremity edema. No clubbing or deformities. Neuro: Alert and oriented x 3.  Grossly intact. Skin: Warm and dry, no jaundice.   Psych: Alert and cooperative, normal mood and affect.   Labs: CMP     Component Value Date/Time   NA 140 06/09/2020 1539   K 3.9 06/09/2020 1539   CL 104 06/09/2020 1539   CO2  29 06/09/2020 1539   GLUCOSE 81 06/09/2020 1539   BUN 16 06/09/2020 1539   CREATININE 0.77 06/09/2020 1539   CALCIUM 9.0 06/09/2020 1539   Lab Results  Component Value Date   WBC 8.3 06/09/2020   HGB 12.4 06/09/2020   HCT 38.4 06/09/2020   MCV 73.7 (L) 06/09/2020   PLT 391.0 06/09/2020    Imaging Studies: No results found.  Assessment and Plan:   Carrie Bryant is a 70 y.o. y/o female here for follow-up of multilobulated colon polyp  Colon polyp showed villous adenoma with high-grade dysplasia.  Dr. Donneta Romberg recommendations are to repeat colonoscopy in 6 to 9 months.  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

## 2020-08-04 NOTE — Telephone Encounter (Signed)
Patient will come in today to see you since she did not wanted to go to the medical mall and have her blood drawn.

## 2020-08-05 ENCOUNTER — Other Ambulatory Visit: Payer: Self-pay | Admitting: Gastroenterology

## 2020-08-05 DIAGNOSIS — K2940 Chronic atrophic gastritis without bleeding: Secondary | ICD-10-CM

## 2020-08-05 LAB — HEMOGLOBIN: Hemoglobin: 14 g/dL (ref 11.1–15.9)

## 2020-08-05 NOTE — Telephone Encounter (Signed)
Called patient to let her know that Dr. Bonna Gains wanted her to have additional labs drawn. Therefore, I called her to tell her but had to leave her a detailed message.

## 2020-08-24 ENCOUNTER — Telehealth: Payer: Self-pay

## 2020-08-24 NOTE — Telephone Encounter (Signed)
-----   Message from Virgel Manifold, MD sent at 08/05/2020  8:34 AM EST ----- Herb Grays please let the patient know, her labs do not show any anemia.  I have ordered 4 other tests due to atrophic gastritis noted on her stomach biopsies.  Please advise her to get these done.  Please release these tests

## 2020-08-24 NOTE — Telephone Encounter (Signed)
Called patient to let her know that she is needing to do additional labs.

## 2021-01-01 ENCOUNTER — Encounter: Payer: Self-pay | Admitting: Emergency Medicine

## 2021-01-01 ENCOUNTER — Ambulatory Visit (INDEPENDENT_AMBULATORY_CARE_PROVIDER_SITE_OTHER): Payer: Medicare Other

## 2021-01-01 ENCOUNTER — Ambulatory Visit
Admission: EM | Admit: 2021-01-01 | Discharge: 2021-01-01 | Disposition: A | Discharge status: Home / Self Care | Payer: Medicare Other | Attending: Emergency Medicine | Admitting: Emergency Medicine

## 2021-01-01 ENCOUNTER — Other Ambulatory Visit: Payer: Self-pay

## 2021-01-01 DIAGNOSIS — R109 Unspecified abdominal pain: Secondary | ICD-10-CM

## 2021-01-01 HISTORY — DX: Calculus of kidney: N20.0

## 2021-01-01 LAB — POCT URINALYSIS DIP (DEVICE)
Bilirubin Urine: NEGATIVE
Glucose, UA: NEGATIVE mg/dL
Ketones, ur: NEGATIVE mg/dL
Leukocytes,Ua: NEGATIVE
Nitrite: NEGATIVE
Protein, ur: 30 mg/dL — AB
Specific Gravity, Urine: 1.02 (ref 1.005–1.030)
Urobilinogen, UA: 0.2 mg/dL (ref 0.0–1.0)
pH: 6 (ref 5.0–8.0)

## 2021-01-01 MED ORDER — ONDANSETRON 8 MG PO TBDP
8.0000 mg | ORAL_TABLET | Freq: Once | ORAL | Status: AC
  Administered 2021-01-01: 11:00:00 8 mg via ORAL

## 2021-01-01 MED ORDER — KETOROLAC TROMETHAMINE 10 MG PO TABS: 10.0000 mg | ORAL_TABLET | Freq: Four times a day (QID) | ORAL | 0 refills | Status: DC | PRN

## 2021-01-01 MED ORDER — PROMETHAZINE HCL 25 MG/ML IJ SOLN
12.5000 mg | Freq: Once | INTRAMUSCULAR | Status: AC
  Administered 2021-01-01: 12:00:00 12.5 mg via INTRAMUSCULAR

## 2021-01-01 MED ORDER — TAMSULOSIN HCL 0.4 MG PO CAPS: 0.4000 mg | ORAL_CAPSULE | Freq: Every day | ORAL | 0 refills | Status: DC

## 2021-01-01 MED ORDER — ONDANSETRON 8 MG PO TBDP: 8.0000 mg | ORAL_TABLET | Freq: Three times a day (TID) | ORAL | 0 refills | Status: DC | PRN

## 2021-01-01 MED ORDER — KETOROLAC TROMETHAMINE 60 MG/2ML IM SOLN
30.0000 mg | Freq: Once | INTRAMUSCULAR | Status: AC
  Administered 2021-01-01: 11:00:00 30 mg via INTRAMUSCULAR

## 2021-01-01 NOTE — ED Triage Notes (Signed)
Pt presents today with c/o of right sided flank pain x 2-3 days, +nausea/vomiting. Hx of kidney stones, dysuria

## 2021-01-01 NOTE — ED Provider Notes (Signed)
[facility]    CSN: [id number] Arrival date & time: 01/01/21  1046      History   Chief Complaint Chief Complaint  Patient presents with   Flank Pain   Back Pain   Emesis    right    HPI Carrie Bryant Carrie Bryant is a 70 y.o. female.   HPI  70 year old female here for evaluation of right flank pain.  Patient is here with her daughter who reports that she has been experiencing right flank pain for the last 2 to 3 days that worsened today.  She developed nausea and vomiting today and is complaining of burning with urination.  She has not had any urgency or frequency of urination or any blood in her urine.  She is also not had a fever.  She does have a history of renal stones that have had to be removed with laser surgery.  Patient actively vomiting in triage and Zofran was administered at that time.  Past Medical History:  Diagnosis Date   Anemia    Anxiety    Arthritis    Asthma    Depression    History of kidney stones    Hypercholesterolemia    Hypertension    Kidney stones    Thyroid disease     Patient Active Problem List   Diagnosis Date Noted   Anemia 06/14/2020   Special screening for malignant neoplasms, colon    Polyp of transverse colon    Polyp of sigmoid colon    Adenomatous polyp of ascending colon    Age-related osteoporosis without current pathological fracture 04/11/2020   Chronic midline low back pain without sciatica 04/11/2020   Primary osteoarthritis involving multiple joints 04/11/2020   Rheumatoid arthritis of multiple sites with negative rheumatoid factor ([place]) 04/11/2020    Past Surgical History:  Procedure Laterality Date   ABDOMINAL HYSTERECTOMY     BIOPSY  07/18/2020   Procedure: BIOPSY;  Surgeon: Irving Copas., MD;  Location: [place];  Service: Gastroenterology;;   COLONOSCOPY WITH PROPOFOL N/A 05/02/2020   Procedure: COLONOSCOPY WITH BIOPSY;  Surgeon: Virgel Manifold, MD;  Location: [place];   Service: Endoscopy;  Laterality: N/A;  PRIORITY 4   COLONOSCOPY WITH PROPOFOL N/A 07/18/2020   Procedure: COLONOSCOPY WITH PROPOFOL;  Surgeon: Rush Landmark Telford Nab., MD;  Location: [place];  Service: Gastroenterology;  Laterality: N/A;   ENDOSCOPIC MUCOSAL RESECTION N/A 07/18/2020   Procedure: ENDOSCOPIC MUCOSAL RESECTION;  Surgeon: Rush Landmark Telford Nab., MD;  Location: [place];  Service: Gastroenterology;  Laterality: N/A;   ESOPHAGOGASTRODUODENOSCOPY (EGD) WITH PROPOFOL N/A 07/18/2020   Procedure: ESOPHAGOGASTRODUODENOSCOPY (EGD) WITH PROPOFOL;  Surgeon: Rush Landmark Telford Nab., MD;  Location: [place];  Service: Gastroenterology;  Laterality: N/A;   HEMOSTASIS CLIP PLACEMENT  07/18/2020   Procedure: HEMOSTASIS CLIP PLACEMENT;  Surgeon: Irving Copas., MD;  Location: [facility] ENDOSCOPY;  Service: Gastroenterology;;   POLYPECTOMY N/A 05/02/2020   Procedure: POLYPECTOMY;  Surgeon: Virgel Manifold, MD;  Location: [place];  Service: Endoscopy;  Laterality: N/A;   POLYPECTOMY  07/18/2020   Procedure: POLYPECTOMY;  Surgeon: Rush Landmark Telford Nab., MD;  Location: [place];  Service: Gastroenterology;;   SUBMUCOSAL LIFTING INJECTION  07/18/2020   Procedure: SUBMUCOSAL LIFTING INJECTION;  Surgeon: Irving Copas., MD;  Location: [place];  Service: Gastroenterology;;    OB History   No obstetric history on file.      Home Medications    Prior to Admission medications   Medication Sig Start Date  End Date Taking? Authorizing Provider  ketorolac (TORADOL) 10 MG tablet Take 1 tablet (10 mg total) by mouth every 6 (six) hours as needed. 01/01/21  Yes Margarette Canada, NP  ondansetron (ZOFRAN ODT) 8 MG disintegrating tablet Take 1 tablet (8 mg total) by mouth every 8 (eight) hours as needed for nausea or vomiting. 01/01/21  Yes Margarette Canada, NP  tamsulosin (FLOMAX) 0.4 MG CAPS capsule Take 1 capsule (0.4 mg total) by mouth daily. 01/01/21  Yes Margarette Canada, NP   albuterol (VENTOLIN HFA) 108 (90 Base) MCG/ACT inhaler Inhale 2 puffs into the lungs every 4 (four) hours as needed for wheezing or shortness of breath. 01/07/20   LampteyMyrene Galas, MD  Calcium Carb-Cholecalciferol (CALCIUM + D3 PO) Take 1 tablet by mouth every evening. Patient not taking: Reported on 08/04/2020    [provider]  celecoxib (CELEBREX) 100 MG capsule Take 100 mg by mouth 2 (two) times daily as needed (pain.). 02/10/20   [provider]  clonazePAM (KLONOPIN) 2 MG tablet Take 2 mg by mouth at bedtime.    [provider]  escitalopram (LEXAPRO) 20 MG tablet Take 20 mg by mouth daily. 10/20/19   [provider]  ferrous sulfate 325 (65 FE) MG tablet Take 325 mg by mouth daily.    [provider]  furosemide (LASIX) 20 MG tablet  12/15/19   [provider]  levothyroxine (SYNTHROID) 25 MCG tablet Take 25 mcg by mouth daily before breakfast. 02/21/20   [provider]  montelukast (SINGULAIR) 10 MG tablet Take 10 mg by mouth at bedtime. 12/15/19   [provider]  simvastatin (ZOCOR) 20 MG tablet Take 20 mg by mouth every evening.    [provider]  traZODone (DESYREL) 150 MG tablet Take 150 mg by mouth at bedtime.    [provider]  valsartan (DIOVAN) 80 MG tablet Take 80 mg by mouth daily. 12/27/19   [provider]  vitamin B-12 (CYANOCOBALAMIN) 1000 MCG tablet Take 1,000 mcg by mouth daily.    [provider]    Family History Family History  Problem Relation Age of Onset   Colon cancer Neg Hx    Esophageal cancer Neg Hx    Inflammatory bowel disease Neg Hx    Liver disease Neg Hx    Pancreatic cancer Neg Hx    Rectal cancer Neg Hx    Stomach cancer Neg Hx     Social History Social History   Tobacco Use   Smoking status: Former    Pack years: 0.00    Types: Cigarettes    Quit date: 1990    Years since quitting: 32.5   Smokeless tobacco: Never   Tobacco comments:     Social smoker "long ago"  Scientific laboratory technician Use: Never used  Substance Use Topics   Alcohol use: Never   Drug use: Never     Allergies   Morphine and related and Codeine   Review of Systems Review of Systems  Constitutional:  Negative for fever.  Gastrointestinal:  Positive for nausea and vomiting. Negative for abdominal pain.  Genitourinary:  Positive for dysuria and flank pain. Negative for frequency, hematuria and urgency.  Hematological: Negative.   Psychiatric/Behavioral: Negative.      Physical Exam Triage Vital Signs ED Triage Vitals  Enc Vitals Group     BP 01/01/21 1053 (!) 160/78     Pulse Rate 01/01/21 1053 65     Resp 01/01/21 1053 18  Temp 01/01/21 1053 98.1 F (36.7 C)     Temp Source 01/01/21 1053 Oral     SpO2 01/01/21 1053 96 %     Weight --      Height --      Head Circumference --      Peak Flow --      Pain Score 01/01/21 1051 8     Pain Loc --      Pain Edu? --      Excl. in [place]? --    No data found.  Updated Vital Signs BP (!) 160/78 (BP Location: Left Arm)   Pulse 65   Temp 98.1 F (36.7 C) (Oral)   Resp 18   SpO2 96%   Visual Acuity Right Eye Distance:   Left Eye Distance:   Bilateral Distance:    Right Eye Near:   Left Eye Near:    Bilateral Near:     Physical Exam Vitals and nursing note reviewed.  Constitutional:      Appearance: Normal appearance. She is ill-appearing.  HENT:     Head: Normocephalic and atraumatic.  Cardiovascular:     Rate and Rhythm: Normal rate and regular rhythm.     Pulses: Normal pulses.     Heart sounds: Normal heart sounds. No murmur heard.   No gallop.  Pulmonary:     Effort: Pulmonary effort is normal.     Breath sounds: Normal breath sounds. No wheezing, rhonchi or rales.  Abdominal:     General: Bowel sounds are normal.     Palpations: Abdomen is soft.     Tenderness: There is abdominal tenderness. There is no right CVA tenderness, left CVA tenderness, guarding or rebound.   Skin:    General: Skin is warm and dry.     Capillary Refill: Capillary refill takes less than 2 seconds.  Neurological:     General: No focal deficit present.     Mental Status: She is alert and oriented to person, place, and time.  Psychiatric:        Mood and Affect: Mood normal.        Behavior: Behavior normal.        Thought Content: Thought content normal.        Judgment: Judgment normal.     UC Treatments / Results  Labs (all labs ordered are listed, but only abnormal results are displayed) Labs Reviewed  POCT URINALYSIS DIP (DEVICE) - Abnormal; Notable for the following components:      Result Value   Hgb urine dipstick SMALL (*)    Protein, ur 30 (*)    All other components within normal limits  URINE CULTURE  POCT URINALYSIS DIPSTICK, ED / UC    EKG   Radiology DG Abdomen 1 View  Result Date: 01/01/2021 CLINICAL DATA:  RIGHT flank pain, nausea, vomiting. History of stones. EXAM: ABDOMEN - 1 VIEW COMPARISON:  CT of the abdomen and pelvis on 05/27/2020 FINDINGS: Bowel gas pattern is nonobstructed. There is moderate stool burden. No calcifications identified in the expected locations of the kidneys or ureters. Vascular calcifications are noted in the pelvis. Visualized osseous structures have a normal appearance. IMPRESSION: No evidence for acute  abnormality.  Moderate stool burden. Electronically Signed   By: Nolon Nations M.D.   On: 01/01/2021 12:06    Procedures Procedures (including critical care time)  Medications Ordered in UC Medications  ondansetron (ZOFRAN-ODT) disintegrating tablet 8 mg (8 mg Oral Given 01/01/21 1109)  ketorolac (TORADOL)  injection 30 mg (30 mg Intramuscular Given 01/01/21 1128)  promethazine (PHENERGAN) injection 12.5 mg (12.5 mg Intramuscular Given 01/01/21 1131)    Initial Impression / Assessment and Plan / UC Course  I have reviewed the triage vital signs and the nursing notes.  Pertinent labs & imaging results that were available  during my care of the patient were reviewed by me and considered in my medical decision making (see chart for details).  Patient is a very pleasant 70 year old female who appears to be in a great deal of pain and is actively dry heaving in the exam room here for evaluation of right flank pain as outlined in the HPI above.  The patient's cardiopulmonary exam is benign.  Patient has no CVA tenderness on exam.  Patient does have an increase in her pain when assisted from laying to the sitting position.  She describes the pain as being down low on her right flank wrapping around to her right mid abdomen and she is having pain in her groin as well.  Patient's abdomen is soft with positive bowel sounds in all 4 quadrants.  She does have mild tenderness in the suprapubic area and right mid abdomen.  Urinalysis collected at triage.  UA shows the presence of protein and the analyzer reported small blood.  Medical assistant reports that according to the colorimetric chart on visualization it was large blood present.  No leukocyte esterase or nitrites.  Patient reports that the Zofran has not helped her nausea significantly.  Will give IM injection of 12.5 mg of Phenergan as well as 30 mg of Toradol to help with pain.  Patient has no history of renal disease and be met from 06/09/2020 shows a BUN of 16 and a creatinine of 0.77.  We will also obtain KUB to look for the presence of renal stone.  KUB inability reviewed and evaluated by me.  Interpretation: There is no evidence of stone visualized on the exam.  Patient does have a significant stool burden in the ascending colon on the right side.  Awaiting radiology overread. Radiology overread agrees with my evaluation.  No renal calculi seen.  I discussed the findings of the urinalysis and the x-ray with the patient and her daughter and reported that there was no stone seen on the KUB and no evidence of obvious infection in her urine.  The flank pain mixed with the  nausea and blood in the urine points most likely to the presence of a kidney stone.  Patient offered the option of trialing oral medication at home to see if she can pass the stone and her symptoms improve or to go to the emergency department for more definitive diagnostic imaging such as a CT scan for definitive diagnosis.  Patient is opting to trial oral medication at home.  Patient daughter advised that if her pain increases or if she starts running a fever she needs to go to the ER for evaluation.  We will send urine for culture even though there was no leukocytes present in the urine as a precaution.   Final Clinical Impressions(s) / UC Diagnoses   Final diagnoses:  Right flank pain     Discharge Instructions      Use the Toradol every 6 hours as needed for pain.  Take it with food to buffer your stomach.  Do not take it with your Celebrex.  Take the tamsulosin once daily at bedtime as we are treating you presumptively for a kidney stone and this  should help it pass if 1 is present.  Use the Zofran every 8 hours as needed for nausea.  If you develop an increase in your pain or a fever you need to go to the emergency department for evaluation.     ED Prescriptions     Medication Sig Dispense Auth. Provider   ketorolac (TORADOL) 10 MG tablet Take 1 tablet (10 mg total) by mouth every 6 (six) hours as needed. 20 tablet Margarette Canada, NP   tamsulosin (FLOMAX) 0.4 MG CAPS capsule Take 1 capsule (0.4 mg total) by mouth daily. 30 capsule Margarette Canada, NP   ondansetron (ZOFRAN ODT) 8 MG disintegrating tablet Take 1 tablet (8 mg total) by mouth every 8 (eight) hours as needed for nausea or vomiting. 20 tablet Margarette Canada, NP      PDMP not reviewed this encounter.   Margarette Canada, NP 01/01/21 1234

## 2021-01-01 NOTE — Discharge Instructions (Addendum)
Use the Toradol every 6 hours as needed for pain.  Take it with food to buffer your stomach.  Do not take it with your Celebrex.  Take the tamsulosin once daily at bedtime as we are treating you presumptively for a kidney stone and this should help it pass if 1 is present.  Use the Zofran every 8 hours as needed for nausea.  If you develop an increase in your pain or a fever you need to go to the emergency department for evaluation.

## 2021-01-03 LAB — URINE CULTURE

## 2021-01-31 ENCOUNTER — Other Ambulatory Visit (INDEPENDENT_AMBULATORY_CARE_PROVIDER_SITE_OTHER): Payer: Medicare Other

## 2021-01-31 ENCOUNTER — Encounter: Payer: Self-pay | Admitting: Gastroenterology

## 2021-01-31 ENCOUNTER — Ambulatory Visit (INDEPENDENT_AMBULATORY_CARE_PROVIDER_SITE_OTHER): Payer: Medicare Other | Admitting: Gastroenterology

## 2021-01-31 VITALS — BP 108/78 | HR 64 | Ht 59.0 in | Wt 149.0 lb

## 2021-01-31 DIAGNOSIS — K2990 Gastroduodenitis, unspecified, without bleeding: Secondary | ICD-10-CM

## 2021-01-31 DIAGNOSIS — R194 Change in bowel habit: Secondary | ICD-10-CM | POA: Diagnosis not present

## 2021-01-31 DIAGNOSIS — D509 Iron deficiency anemia, unspecified: Secondary | ICD-10-CM

## 2021-01-31 DIAGNOSIS — K31A0 Gastric intestinal metaplasia, unspecified: Secondary | ICD-10-CM | POA: Diagnosis not present

## 2021-01-31 DIAGNOSIS — K635 Polyp of colon: Secondary | ICD-10-CM

## 2021-01-31 DIAGNOSIS — K2940 Chronic atrophic gastritis without bleeding: Secondary | ICD-10-CM

## 2021-01-31 DIAGNOSIS — M549 Dorsalgia, unspecified: Secondary | ICD-10-CM

## 2021-01-31 DIAGNOSIS — R39859 Costovertebral (angle) tenderness, unspecified side: Secondary | ICD-10-CM

## 2021-01-31 DIAGNOSIS — D128 Benign neoplasm of rectum: Secondary | ICD-10-CM

## 2021-01-31 DIAGNOSIS — K2970 Gastritis, unspecified, without bleeding: Secondary | ICD-10-CM

## 2021-01-31 DIAGNOSIS — R823 Hemoglobinuria: Secondary | ICD-10-CM

## 2021-01-31 DIAGNOSIS — Z87442 Personal history of urinary calculi: Secondary | ICD-10-CM

## 2021-01-31 LAB — URINALYSIS, ROUTINE W REFLEX MICROSCOPIC
Bilirubin Urine: NEGATIVE
Ketones, ur: NEGATIVE
Leukocytes,Ua: NEGATIVE
Nitrite: NEGATIVE
Specific Gravity, Urine: 1.015 (ref 1.000–1.030)
Total Protein, Urine: NEGATIVE
Urine Glucose: NEGATIVE
Urobilinogen, UA: 0.2 (ref 0.0–1.0)
pH: 6 (ref 5.0–8.0)

## 2021-01-31 LAB — B12 AND FOLATE PANEL
Folate: 23.2 ng/mL (ref >5.9)
Vitamin B-12: 536 pg/mL (ref 211–911)

## 2021-01-31 LAB — COMPREHENSIVE METABOLIC PANEL WITH GFR
ALT: 16 U/L (ref 0–35)
AST: 18 U/L (ref 0–37)
Albumin: 4.4 g/dL (ref 3.5–5.2)
Alkaline Phosphatase: 63 U/L (ref 39–117)
BUN: 15 mg/dL (ref 6–23)
CO2: 25 meq/L (ref 19–32)
Calcium: 9.7 mg/dL (ref 8.4–10.5)
Chloride: 103 meq/L (ref 96–112)
Creatinine, Ser: 0.74 mg/dL (ref 0.40–1.20)
GFR: 81.95 mL/min (ref >60.00)
Glucose, Bld: 84 mg/dL (ref 70–99)
Potassium: 3.7 meq/L (ref 3.5–5.1)
Sodium: 139 meq/L (ref 135–145)
Total Bilirubin: 0.4 mg/dL (ref 0.2–1.2)
Total Protein: 7.7 g/dL (ref 6.0–8.3)

## 2021-01-31 LAB — CBC
HCT: 43.8 % (ref 36.0–46.0)
Hemoglobin: 14.8 g/dL (ref 12.0–15.0)
MCHC: 33.8 g/dL (ref 30.0–36.0)
MCV: 88.9 fl (ref 78.0–100.0)
Platelets: 312 K/uL (ref 150.0–400.0)
RBC: 4.93 Mil/uL (ref 3.87–5.11)
RDW: 13.4 % (ref 11.5–15.5)
WBC: 6.4 K/uL (ref 4.0–10.5)

## 2021-01-31 LAB — IBC + FERRITIN
Ferritin: 30.3 ng/mL (ref 10.0–291.0)
Iron: 83 ug/dL (ref 42–145)
Saturation Ratios: 19 % — ABNORMAL LOW (ref 20.0–50.0)
Transferrin: 312 mg/dL (ref 212.0–360.0)

## 2021-01-31 LAB — TSH: TSH: 1.98 u[IU]/mL (ref 0.35–5.50)

## 2021-01-31 LAB — LIPASE: Lipase: 20 U/L (ref 11.0–59.0)

## 2021-01-31 MED ORDER — ESOMEPRAZOLE MAGNESIUM 40 MG PO CPDR
40.0000 mg | DELAYED_RELEASE_CAPSULE | Freq: Every day | ORAL | 6 refills | Status: DC
Start: 2021-01-31 — End: 2021-08-14

## 2021-01-31 NOTE — Patient Instructions (Addendum)
Your provider has requested that you go to the basement level for lab work before leaving today. Press "B" on the elevator. The lab is located at the first door on the left as you exit the elevator.  You have been scheduled for a colonoscopy. Please follow written instructions given to you at your visit today.  Please pick up your prep supplies at the pharmacy within the next 1-3 days. If you use inhalers (even only as needed), please bring them with you on the day of your procedure.  We have sent the following medications to your pharmacy for you to pick up at your convenience: Nexium  Start: Nexium 59m - Take 1 capsule by mouth once daily.    You have been scheduled for a CT scan of the abdomen and pelvis [facility] -follow arrows to imaging.  You are scheduled on 02/14/21  at 1:00pm. You should arrive 15 minutes prior to your appointment time for registration.  The solution may taste better if refrigerated, but do NOT add ice or any other liquid to this solution. Shake well before drinking.   Please follow the written instructions below on the day of your exam:   1) Do not eat anything after 9:00am (4 hours prior to your test)   2) Drink 1 bottle of contrast @ 11:00am (2 hours prior to your exam)  Remember to shake well before drinking and do NOT pour over ice.     Drink 1 bottle of contrast @ 12:00pm  (1 hour prior to your exam)   You may take any medications as prescribed with a small amount of water, if necessary. If you take any of the following medications: METFORMIN, GLUCOPHAGE, GLUCOVANCE, AVANDAMET, RIOMET, FORTAMET, AMacyMET, JANUMET, GLUMETZA or METAGLIP, you MAY be asked to HOLD this medication 48 hours AFTER the exam.   The purpose of you drinking the oral contrast is to aid in the visualization of your intestinal tract. The contrast solution may cause some diarrhea. Depending on your individual set of symptoms, you may also receive an intravenous  injection of x-ray contrast/dye. [facility], depending on the type of exam you are having performed.   If you have any questions regarding your exam or if you need to reschedule, you may call [facility] at [phone] the hours of 8:00 am and 5:00 pm, Monday-Friday.   Due to recent changes in healthcare laws, you may see the results of your imaging and laboratory studies on MyChart before your provider has had a chance to review them.  We understand that in some cases there may be results that are confusing or concerning to you. Not all laboratory results come back in the same time frame and the provider may be waiting for multiple results in order to interpret others.  Please give [id number] hours in order for your provider to thoroughly review all the results before contacting the office for clarification of your results.   If you are age 4042or older, your body mass index should be between 23-30. Your Body mass index is 30.09 kg/m. If this is out of the aforementioned range listed, please consider follow up with your Primary Care Provider. __________________________________________________________  The [facility] [place] [facility] providers would like to encourage you to use [facility] communicate with providers for non-urgent requests or questions.  Due to long hold times on the telephone, sending your provider a message by [facility]  may be a faster and more efficient way to get a response.  Please allow 48 business hours for a response.  Please remember that this is for non-urgent requests.   Thank you for choosing me and [facility].  Dr. Rush Landmark

## 2021-01-31 NOTE — H&P (View-Only) (Signed)
Gravity VISIT   Primary Care Provider Revelo, Elyse Jarvis, MD [address] [place] [address] [phone]   Patient Profile: Carrie Bryant is a 70 y.o. female with a pmh significant for hypertension, hyperlipidemia, asthma, nephrolithiasis, status post hysterectomy, colon polyps (TAs & TVAs), autoimmune gastritis.  The patient presents to the [facility] for an evaluation and management of problem(s) noted below:  Problem List 1. Iron deficiency anemia, unspecified iron deficiency anemia type   2. Change in bowel habits   3. Autoimmune gastritis   4. Gastric intestinal metaplasia   5. Pain of costovertebral angle   6. Hemoglobinuria   7. History of kidney stones   8. Tubulovillous adenoma of rectum     History of Present Illness Please see prior consultation note/clinic notes for full details of HPI.  Interval History The patient returns for follow-up.  She is due for colonoscopy for surveillance of the large rectal TVA that was removed earlier this year.  There was evidence of some high-grade dysplasia but no cancer found.  This polyp was removed in piecemeal fashion.  The patient was also found to have intestinal metaplasia with concern for autoimmune gastritis.  She was seen in follow-up by [facility] and labs were ordered to further evaluate for potential autoimmune gastritis but those were not completed.  She has not undergone gastric mapping as of yet.  Over the last 2 months she has noted a change in her bowel habits and she is having more issues with constipation that she had previously.  She is not sure what has changed but she is certainly concerned about this.  She seen no blood in her stools.  However the last few weeks she has been experiencing some discomfort in her back moving towards her groin for which she is concerned that she may have recurrent nephrolithiasis (she has a history of kidney stones  per her report in [place]).  The patient was initiated on Toradol as well as given Zofran to help with nausea.  Symptoms have persisted and she is waiting to see her primary care doctor next week for further work-up of this but at this clinic appointment already scheduled.  She has some mild burning at times of urination but does not feel that she is experiencing a urinary tract infection.  She believes she saw blood when the discomfort in the back started but she has not noted any blood grossly in her urine since.  She did have some hemoglobinuria on UA at urgent care earlier last month.  Patient denies any significant pyrosis symptoms currently but at times she will have some "hunger pains."  She previously has been diagnosed with gastritis in [place] and then on omeprazole but she has not been on it here in the [place].  She is not sure if it made any difference when she was on omeprazole but had only tried that.  GI Review of Systems Positive as above Negative for odynophagia, nausea currently, blood in stools (melena/hematochezia)   Review of Systems General: Denies fevers/chills/weight loss unintentionally Cardiovascular: Denies chest pain Pulmonary: Denies shortness of breath Gastroenterological: See HPI Genitourinary: Denies darkened urine but concern as noted above for potentially recent blood in urine weeks ago Hematological: Denies easy bruising/bleeding Dermatological: Denies jaundice Psychological: Mood is stable but she is very anxious about whether the polyp has returned and why she is having her symptoms persist   Medications Current Outpatient Medications  Medication  Sig Dispense Refill   albuterol (VENTOLIN HFA) 108 (90 Base) MCG/ACT inhaler Inhale 2 puffs into the lungs every 4 (four) hours as needed for wheezing or shortness of breath. 18 g 0   celecoxib (CELEBREX) 100 MG capsule Take 100 mg by mouth 2 (two) times daily as needed (pain.).     clonazePAM  (KLONOPIN) 2 MG tablet Take 2 mg by mouth at bedtime.     escitalopram (LEXAPRO) 20 MG tablet Take 20 mg by mouth daily.     esomeprazole (NEXIUM) 40 MG capsule Take 1 capsule (40 mg total) by mouth daily at 12 noon. 30 capsule 6   ferrous sulfate 325 (65 FE) MG tablet Take 325 mg by mouth daily.     furosemide (LASIX) 20 MG tablet      ketorolac (TORADOL) 10 MG tablet Take 1 tablet (10 mg total) by mouth every 6 (six) hours as needed. 20 tablet 0   levothyroxine (SYNTHROID) 25 MCG tablet Take 25 mcg by mouth daily before breakfast.     montelukast (SINGULAIR) 10 MG tablet Take 10 mg by mouth at bedtime.     Multiple Vitamin (MULTIVITAMIN) tablet Take 1 tablet by mouth daily.     ondansetron (ZOFRAN ODT) 8 MG disintegrating tablet Take 1 tablet (8 mg total) by mouth every 8 (eight) hours as needed for nausea or vomiting. 20 tablet 0   simvastatin (ZOCOR) 20 MG tablet Take 20 mg by mouth every evening.     tamsulosin (FLOMAX) 0.4 MG CAPS capsule Take 1 capsule (0.4 mg total) by mouth daily. 30 capsule 0   traZODone (DESYREL) 150 MG tablet Take 150 mg by mouth at bedtime.     valsartan (DIOVAN) 80 MG tablet Take 80 mg by mouth daily.     vitamin B-12 (CYANOCOBALAMIN) 1000 MCG tablet Take 1,000 mcg by mouth daily.     No current facility-administered medications for this visit.    Allergies Allergies  Allergen Reactions   Morphine And Related Anaphylaxis and Other (See Comments)    dizziness   Codeine Anxiety and Other (See Comments)    Hypertension (elevates BP) & chest pain.    Histories Past Medical History:  Diagnosis Date   Anemia    Anxiety    Arthritis    Asthma    Depression    History of kidney stones    Hypercholesterolemia    Hypertension    Kidney stones    Thyroid disease    Past Surgical History:  Procedure Laterality Date   ABDOMINAL HYSTERECTOMY     BIOPSY  07/18/2020   Procedure: BIOPSY;  Surgeon: Irving Copas., MD;  Location: [facility] ENDOSCOPY;   Service: Gastroenterology;;   COLONOSCOPY WITH PROPOFOL N/A 05/02/2020   Procedure: COLONOSCOPY WITH BIOPSY;  Surgeon: Virgel Manifold, MD;  Location: [place];  Service: Endoscopy;  Laterality: N/A;  PRIORITY 4   COLONOSCOPY WITH PROPOFOL N/A 07/18/2020   Procedure: COLONOSCOPY WITH PROPOFOL;  Surgeon: Rush Landmark Telford Nab., MD;  Location: [place];  Service: Gastroenterology;  Laterality: N/A;   ENDOSCOPIC MUCOSAL RESECTION N/A 07/18/2020   Procedure: ENDOSCOPIC MUCOSAL RESECTION;  Surgeon: Rush Landmark Telford Nab., MD;  Location: [place];  Service: Gastroenterology;  Laterality: N/A;   ESOPHAGOGASTRODUODENOSCOPY (EGD) WITH PROPOFOL N/A 07/18/2020   Procedure: ESOPHAGOGASTRODUODENOSCOPY (EGD) WITH PROPOFOL;  Surgeon: Rush Landmark Telford Nab., MD;  Location: [place];  Service: Gastroenterology;  Laterality: N/A;   HEMOSTASIS CLIP PLACEMENT  07/18/2020   Procedure: HEMOSTASIS CLIP PLACEMENT;  Surgeon: Justice Britain  Brooke Bonito., MD;  Location: [place];  Service: Gastroenterology;;   POLYPECTOMY N/A 05/02/2020   Procedure: POLYPECTOMY;  Surgeon: Virgel Manifold, MD;  Location: [place];  Service: Endoscopy;  Laterality: N/A;   POLYPECTOMY  07/18/2020   Procedure: POLYPECTOMY;  Surgeon: Mansouraty, Telford Nab., MD;  Location: [place];  Service: Gastroenterology;;   SUBMUCOSAL LIFTING INJECTION  07/18/2020   Procedure: SUBMUCOSAL LIFTING INJECTION;  Surgeon: Irving Copas., MD;  Location: [facility] ENDOSCOPY;  Service: Gastroenterology;;   Social History   Socioeconomic History   Marital status: Married    Spouse name: Not on file   Number of children: Not on file   Years of education: Not on file   Highest education level: Not on file  Occupational History   Not on file  Tobacco Use   Smoking status: Never   Smokeless tobacco: Never  Vaping Use   Vaping Use: Never used  Substance and Sexual Activity   Alcohol use: Never   Drug use: Never    Sexual activity: Not on file  Other Topics Concern   Not on file  Social History Narrative   Not on file   Social Determinants of Health   Financial Resource Strain: Not on file  Food Insecurity: Not on file  Transportation Needs: Not on file  Physical Activity: Not on file  Stress: Not on file  Social Connections: Not on file  Intimate Partner Violence: Not on file   Family History  Problem Relation Age of Onset   Colon cancer Neg Hx    Esophageal cancer Neg Hx    Inflammatory bowel disease Neg Hx    Liver disease Neg Hx    Pancreatic cancer Neg Hx    Rectal cancer Neg Hx    Stomach cancer Neg Hx    I have reviewed her medical, social, and family history in detail and updated the electronic medical record as necessary.    PHYSICAL EXAMINATION  BP 108/78   Pulse 64   Ht '4\' 11"'  (1.499 m)   Wt 149 lb (67.6 kg)   BMI 30.09 kg/m  Wt Readings from Last 3 Encounters:  01/31/21 149 lb (67.6 kg)  08/04/20 150 lb (68 kg)  07/18/20 149 lb (67.6 kg)  GEN: NAD, appears stated age, doesn't appear chronically ill, accompanied by daughter PSYCH: Cooperative, without pressured speech EYE: Conjunctivae pink, sclerae anicteric ENT: MMM CV: Nontachycardic RESP: No audible wheezing GI: NABS, soft, NT/ND, without rebound or guarding, MSK/EXT: Right CVA tenderness is noted with patient describing discomfort at times (not currently) moving into the groin, trace bilateral lower extremity edema present SKIN: No jaundice NEURO:  Alert & Oriented x 3, no focal deficits   REVIEW OF DATA  I reviewed the following data at the time of this encounter:  GI Procedures and Studies  January 2022 EGD - No gross lesions in esophagus. Z-line irregular, 32 cm from the incisors. - 2 cm hiatal hernia. Erythematous mucosa in the antrum. No other gross lesions in the stomach. Biopsied for HP. - No gross lesions in the duodenal bulb, in the first portion of the duodenum and in the second portion of  the duodenum. Biopsied.  January 2022 colonoscopy - Palpable rectal nodularity and hemorrhoids found on digital rectal exam. - The examined portion of the ileum was normal. - Six 2 to 5 mm polyps at the recto-sigmoid colon, in the sigmoid colon, in the descending colon, in the transverse colon, in the ascending colon and in the  cecum, removed with a cold snare. Resected and retrieved. - One 40 mm mixed-lateral spreading JNET1 & JNET2 appearing polyp in the mid rectum (8-11 cm), removed with piecemeal mucosal resection. Resected and retrieved. Treated with a hot snare tip to areas of oozing. STSC performed to the margin. Clips (MR conditional) were placed. - Non-bleeding non-thrombosed external and internal hemorrhoids.  Pathology FINAL MICROSCOPIC DIAGNOSIS:  A. DUODENUM; BIOPSY:  - Duodenal mucosa with no specific histopathologic changes  - Negative for increased intraepithelial lymphocytes or villous  architectural changes  B. STOMACH, BIOPSY:  - Gastric antral type mucosa showing patchy chronic gastritis with  atrophy and intestinal metaplasia and focal micronodular ECL cell  hyperplasia, consistent with atrophic autoimmune gastritis, see comment  - Negative for dysplasia  - Warthin-Starry stain is negative for Helicobacter pylori  C. COLON POLYPS:  - Tubulovillous adenoma(s)  - Negative for high-grade dysplasia or malignancy  D. RECTAL POLYP:  - Fragments of villous adenoma(s) with very focal high-grade dysplasia  - Negative for carcinoma  COMMENT:  B.   Immunohistochemical stain for synaptophysin shows linear and micronodular ECL cell hyperplasia in the atrophic fragments, consistent with above interpretation.   Laboratory Studies  Reviewed those in epic  Imaging Studies  No new studies to review   ASSESSMENT  Ms. Carrie Bryant is a 70 y.o. female with a pmh significant for hypertension, hyperlipidemia, asthma, nephrolithiasis, status post hysterectomy, colon polyps (TAs &  TVAs), autoimmune gastritis.   The patient is seen today for evaluation and management of:  1. Iron deficiency anemia, unspecified iron deficiency anemia type   2. Change in bowel habits   3. Autoimmune gastritis   4. Gastric intestinal metaplasia   5. Pain of costovertebral angle   6. Hemoglobinuria   7. History of kidney stones   8. Tubulovillous adenoma of rectum    The patient is hemodynamically stable.  She is due in the coming months for follow-up colonoscopy for large rectal TVA piecemeal resection follow-up.  Hopefully things will look good that she will not require other salvage resection techniques-time will tell.  Patient is experienced some "hunger pains" at times and has known gastritis likely autoimmune with intestinal metaplasia.  Gastric mapping will be performed at the time of follow-up endoscopy to help define.  In time by which the patient will need to have surveillance endoscopies.  Patient's current issues are concerning for the possibility of nephrolithiasis and I will go ahead and move forward with ordering a CT scan of the abdomen/pelvis for further work-up of this and hopefully this will be able to be completed before she ends up seeing her PCP who will direct care thereafter.  If she develops fevers or chills she will let us know.  We will obtain some laboratories as well as a UA and urine culture to further define if she may have some other issues that need treatment she will get preprocedure labs as well.  She has had iron deficiency in the past we will see how her iron indices look.  If she still has iron deficiency anemia then she may require a video capsule endoscopy at some point in the future.    The risks and benefits of endoscopic evaluation were discussed with the patient; these include but are not limited to the risk of perforation, infection, bleeding, missed lesions, lack of diagnosis, severe illness requiring hospitalization, as well as anesthesia and sedation  related illnesses.  The patient and/or family is agreeable to proceed.  All patient  questions were answered to the best of my ability, and the patient agrees to the aforementioned plan of action with follow-up as indicated.   PLAN  Laboratories as outlined below Follow-up laboratories as ordered by [facility] to be obtained UA and urine culture to be obtained Proceed with scheduling CT abdomen/pelvis to rule out nephrolithiasis Proceed with scheduling colonoscopy for polyp surveillance Proceed with scheduling surveillance endoscopy for gastric intestinal metaplasia/atrophic gastritis   Orders Placed This Encounter  Procedures   Procedural/ Surgical Case Request: COLONOSCOPY WITH PROPOFOL, ENDOSCOPIC MUCOSAL RESECTION   Urine Culture   CT Abdomen Pelvis W Contrast   CBC   Comp Met (CMET)   Lipase   TSH   B12 and Folate Panel   Urinalysis   Anti-parietal antibody   Gastrin   Intrinsic Factor Antibodies   IBC + Ferritin   Ambulatory referral to Gastroenterology     New Prescriptions   ESOMEPRAZOLE (NEXIUM) 40 MG CAPSULE    Take 1 capsule (40 mg total) by mouth daily at 12 noon.   Modified Medications   No medications on file    Planned Follow Up No follow-ups on file.   Total Time in Face-to-Face and in Coordination of Care for patient including independent/personal interpretation/review of prior testing, medical history, examination, medication adjustment, communicating results with the patient directly, and documentation with the EHR is 45 minutes.  Justice Britain, MD [facility] Office # [phone]

## 2021-01-31 NOTE — Progress Notes (Signed)
[facility] VISIT   Primary Care Provider Revelo, Elyse Jarvis, MD [address] [place] [address] [phone]   Patient Profile: Carrie Bryant is a 69 y.o. female with a pmh significant for hypertension, hyperlipidemia, asthma, nephrolithiasis, status post hysterectomy, colon polyps (TAs & TVAs), autoimmune gastritis.  The patient presents to the [facility] for an evaluation and management of problem(s) noted below:  Problem List 1. Iron deficiency anemia, unspecified iron deficiency anemia type   2. Change in bowel habits   3. Autoimmune gastritis   4. Gastric intestinal metaplasia   5. Pain of costovertebral angle   6. Hemoglobinuria   7. History of kidney stones   8. Tubulovillous adenoma of rectum     History of Present Illness Please see prior consultation note/clinic notes for full details of HPI.  Interval History The patient returns for follow-up.  She is due for colonoscopy for surveillance of the large rectal TVA that was removed earlier this year.  There was evidence of some high-grade dysplasia but no cancer found.  This polyp was removed in piecemeal fashion.  The patient was also found to have intestinal metaplasia with concern for autoimmune gastritis.  She was seen in follow-up by [place] and labs were ordered to further evaluate for potential autoimmune gastritis but those were not completed.  She has not undergone gastric mapping as of yet.  Over the last 2 months she has noted a change in her bowel habits and she is having more issues with constipation that she had previously.  She is not sure what has changed but she is certainly concerned about this.  She seen no blood in her stools.  However the last few weeks she has been experiencing some discomfort in her back moving towards her groin for which she is concerned that she may have recurrent nephrolithiasis (she has a history of kidney stones  per her report in [place]).  The patient was initiated on Toradol as well as given Zofran to help with nausea.  Symptoms have persisted and she is waiting to see her primary care doctor next week for further work-up of this but at this clinic appointment already scheduled.  She has some mild burning at times of urination but does not feel that she is experiencing a urinary tract infection.  She believes she saw blood when the discomfort in the back started but she has not noted any blood grossly in her urine since.  She did have some hemoglobinuria on UA at urgent care earlier last month.  Patient denies any significant pyrosis symptoms currently but at times she will have some "hunger pains."  She previously has been diagnosed with gastritis in [place] and then on omeprazole but she has not been on it here in the [place].  She is not sure if it made any difference when she was on omeprazole but had only tried that.  GI Review of Systems Positive as above Negative for odynophagia, nausea currently, blood in stools (melena/hematochezia)   Review of Systems General: Denies fevers/chills/weight loss unintentionally Cardiovascular: Denies chest pain Pulmonary: Denies shortness of breath Gastroenterological: See HPI Genitourinary: Denies darkened urine but concern as noted above for potentially recent blood in urine weeks ago Hematological: Denies easy bruising/bleeding Dermatological: Denies jaundice Psychological: Mood is stable but she is very anxious about whether the polyp has returned and why she is having her symptoms persist   Medications Current Outpatient Medications  Medication  Sig Dispense Refill   albuterol (VENTOLIN HFA) 108 (90 Base) MCG/ACT inhaler Inhale 2 puffs into the lungs every 4 (four) hours as needed for wheezing or shortness of breath. 18 g 0   celecoxib (CELEBREX) 100 MG capsule Take 100 mg by mouth 2 (two) times daily as needed (pain.).     clonazePAM  (KLONOPIN) 2 MG tablet Take 2 mg by mouth at bedtime.     escitalopram (LEXAPRO) 20 MG tablet Take 20 mg by mouth daily.     esomeprazole (NEXIUM) 40 MG capsule Take 1 capsule (40 mg total) by mouth daily at 12 noon. 30 capsule 6   ferrous sulfate 325 (65 FE) MG tablet Take 325 mg by mouth daily.     furosemide (LASIX) 20 MG tablet      ketorolac (TORADOL) 10 MG tablet Take 1 tablet (10 mg total) by mouth every 6 (six) hours as needed. 20 tablet 0   levothyroxine (SYNTHROID) 25 MCG tablet Take 25 mcg by mouth daily before breakfast.     montelukast (SINGULAIR) 10 MG tablet Take 10 mg by mouth at bedtime.     Multiple Vitamin (MULTIVITAMIN) tablet Take 1 tablet by mouth daily.     ondansetron (ZOFRAN ODT) 8 MG disintegrating tablet Take 1 tablet (8 mg total) by mouth every 8 (eight) hours as needed for nausea or vomiting. 20 tablet 0   simvastatin (ZOCOR) 20 MG tablet Take 20 mg by mouth every evening.     tamsulosin (FLOMAX) 0.4 MG CAPS capsule Take 1 capsule (0.4 mg total) by mouth daily. 30 capsule 0   traZODone (DESYREL) 150 MG tablet Take 150 mg by mouth at bedtime.     valsartan (DIOVAN) 80 MG tablet Take 80 mg by mouth daily.     vitamin B-12 (CYANOCOBALAMIN) 1000 MCG tablet Take 1,000 mcg by mouth daily.     No current facility-administered medications for this visit.    Allergies Allergies  Allergen Reactions   Morphine And Related Anaphylaxis and Other (See Comments)    dizziness   Codeine Anxiety and Other (See Comments)    Hypertension (elevates BP) & chest pain.    Histories Past Medical History:  Diagnosis Date   Anemia    Anxiety    Arthritis    Asthma    Depression    History of kidney stones    Hypercholesterolemia    Hypertension    Kidney stones    Thyroid disease    Past Surgical History:  Procedure Laterality Date   ABDOMINAL HYSTERECTOMY     BIOPSY  07/18/2020   Procedure: BIOPSY;  Surgeon: Irving Copas., MD;  Location: [facility] ENDOSCOPY;   Service: Gastroenterology;;   COLONOSCOPY WITH PROPOFOL N/A 05/02/2020   Procedure: COLONOSCOPY WITH BIOPSY;  Surgeon: Virgel Manifold, MD;  Location: [place];  Service: Endoscopy;  Laterality: N/A;  PRIORITY 4   COLONOSCOPY WITH PROPOFOL N/A 07/18/2020   Procedure: COLONOSCOPY WITH PROPOFOL;  Surgeon: Rush Landmark Telford Nab., MD;  Location: [place];  Service: Gastroenterology;  Laterality: N/A;   ENDOSCOPIC MUCOSAL RESECTION N/A 07/18/2020   Procedure: ENDOSCOPIC MUCOSAL RESECTION;  Surgeon: Rush Landmark Telford Nab., MD;  Location: [place];  Service: Gastroenterology;  Laterality: N/A;   ESOPHAGOGASTRODUODENOSCOPY (EGD) WITH PROPOFOL N/A 07/18/2020   Procedure: ESOPHAGOGASTRODUODENOSCOPY (EGD) WITH PROPOFOL;  Surgeon: Rush Landmark Telford Nab., MD;  Location: [place];  Service: Gastroenterology;  Laterality: N/A;   HEMOSTASIS CLIP PLACEMENT  07/18/2020   Procedure: HEMOSTASIS CLIP PLACEMENT;  Surgeon: Justice Britain  Brooke Bonito., MD;  Location: [place];  Service: Gastroenterology;;   POLYPECTOMY N/A 05/02/2020   Procedure: POLYPECTOMY;  Surgeon: Virgel Manifold, MD;  Location: [place];  Service: Endoscopy;  Laterality: N/A;   POLYPECTOMY  07/18/2020   Procedure: POLYPECTOMY;  Surgeon: Mansouraty, Telford Nab., MD;  Location: [place];  Service: Gastroenterology;;   SUBMUCOSAL LIFTING INJECTION  07/18/2020   Procedure: SUBMUCOSAL LIFTING INJECTION;  Surgeon: Irving Copas., MD;  Location: [facility] ENDOSCOPY;  Service: Gastroenterology;;   Social History   Socioeconomic History   Marital status: Married    Spouse name: Not on file   Number of children: Not on file   Years of education: Not on file   Highest education level: Not on file  Occupational History   Not on file  Tobacco Use   Smoking status: Never   Smokeless tobacco: Never  Vaping Use   Vaping Use: Never used  Substance and Sexual Activity   Alcohol use: Never   Drug use: Never    Sexual activity: Not on file  Other Topics Concern   Not on file  Social History Narrative   Not on file   Social Determinants of Health   Financial Resource Strain: Not on file  Food Insecurity: Not on file  Transportation Needs: Not on file  Physical Activity: Not on file  Stress: Not on file  Social Connections: Not on file  Intimate Partner Violence: Not on file   Family History  Problem Relation Age of Onset   Colon cancer Neg Hx    Esophageal cancer Neg Hx    Inflammatory bowel disease Neg Hx    Liver disease Neg Hx    Pancreatic cancer Neg Hx    Rectal cancer Neg Hx    Stomach cancer Neg Hx    I have reviewed her medical, social, and family history in detail and updated the electronic medical record as necessary.    PHYSICAL EXAMINATION  BP 108/78   Pulse 64   Ht '4\' 11"'  (1.499 m)   Wt 149 lb (67.6 kg)   BMI 30.09 kg/m  Wt Readings from Last 3 Encounters:  01/31/21 149 lb (67.6 kg)  08/04/20 150 lb (68 kg)  07/18/20 149 lb (67.6 kg)  GEN: NAD, appears stated age, doesn't appear chronically ill, accompanied by daughter PSYCH: Cooperative, without pressured speech EYE: Conjunctivae pink, sclerae anicteric ENT: MMM CV: Nontachycardic RESP: No audible wheezing GI: NABS, soft, NT/ND, without rebound or guarding, MSK/EXT: Right CVA tenderness is noted with patient describing discomfort at times (not currently) moving into the groin, trace bilateral lower extremity edema present SKIN: No jaundice NEURO:  Alert & Oriented x 3, no focal deficits   REVIEW OF DATA  I reviewed the following data at the time of this encounter:  GI Procedures and Studies  January 2022 EGD - No gross lesions in esophagus. Z-line irregular, 32 cm from the incisors. - 2 cm hiatal hernia. Erythematous mucosa in the antrum. No other gross lesions in the stomach. Biopsied for HP. - No gross lesions in the duodenal bulb, in the first portion of the duodenum and in the second portion of  the duodenum. Biopsied.  January 2022 colonoscopy - Palpable rectal nodularity and hemorrhoids found on digital rectal exam. - The examined portion of the ileum was normal. - Six 2 to 5 mm polyps at the recto-sigmoid colon, in the sigmoid colon, in the descending colon, in the transverse colon, in the ascending colon and in the  cecum, removed with a cold snare. Resected and retrieved. - One 40 mm mixed-lateral spreading JNET1 & JNET2 appearing polyp in the mid rectum (8-11 cm), removed with piecemeal mucosal resection. Resected and retrieved. Treated with a hot snare tip to areas of oozing. STSC performed to the margin. Clips (MR conditional) were placed. - Non-bleeding non-thrombosed external and internal hemorrhoids.  Pathology FINAL MICROSCOPIC DIAGNOSIS:  A. DUODENUM; BIOPSY:  - Duodenal mucosa with no specific histopathologic changes  - Negative for increased intraepithelial lymphocytes or villous  architectural changes  B. STOMACH, BIOPSY:  - Gastric antral type mucosa showing patchy chronic gastritis with  atrophy and intestinal metaplasia and focal micronodular ECL cell  hyperplasia, consistent with atrophic autoimmune gastritis, see comment  - Negative for dysplasia  - Warthin-Starry stain is negative for Helicobacter pylori  C. COLON POLYPS:  - Tubulovillous adenoma(s)  - Negative for high-grade dysplasia or malignancy  D. RECTAL POLYP:  - Fragments of villous adenoma(s) with very focal high-grade dysplasia  - Negative for carcinoma  COMMENT:  B.   Immunohistochemical stain for synaptophysin shows linear and micronodular ECL cell hyperplasia in the atrophic fragments, consistent with above interpretation.   Laboratory Studies  Reviewed those in epic  Imaging Studies  No new studies to review   ASSESSMENT  Ms. Carrie Bryant is a 70 y.o. female with a pmh significant for hypertension, hyperlipidemia, asthma, nephrolithiasis, status post hysterectomy, colon polyps (TAs &  TVAs), autoimmune gastritis.   The patient is seen today for evaluation and management of:  1. Iron deficiency anemia, unspecified iron deficiency anemia type   2. Change in bowel habits   3. Autoimmune gastritis   4. Gastric intestinal metaplasia   5. Pain of costovertebral angle   6. Hemoglobinuria   7. History of kidney stones   8. Tubulovillous adenoma of rectum    The patient is hemodynamically stable.  She is due in the coming months for follow-up colonoscopy for large rectal TVA piecemeal resection follow-up.  Hopefully things will look good that she will not require other salvage resection techniques-time will tell.  Patient is experienced some "hunger pains" at times and has known gastritis likely autoimmune with intestinal metaplasia.  Gastric mapping will be performed at the time of follow-up endoscopy to help define.  In time by which the patient will need to have surveillance endoscopies.  Patient's current issues are concerning for the possibility of nephrolithiasis and I will go ahead and move forward with ordering a CT scan of the abdomen/pelvis for further work-up of this and hopefully this will be able to be completed before she ends up seeing her PCP who will direct care thereafter.  If she develops fevers or chills she will let us know.  We will obtain some laboratories as well as a UA and urine culture to further define if she may have some other issues that need treatment she will get preprocedure labs as well.  She has had iron deficiency in the past we will see how her iron indices look.  If she still has iron deficiency anemia then she may require a video capsule endoscopy at some point in the future.    The risks and benefits of endoscopic evaluation were discussed with the patient; these include but are not limited to the risk of perforation, infection, bleeding, missed lesions, lack of diagnosis, severe illness requiring hospitalization, as well as anesthesia and sedation  related illnesses.  The patient and/or family is agreeable to proceed.  All patient  questions were answered to the best of my ability, and the patient agrees to the aforementioned plan of action with follow-up as indicated.   PLAN  Laboratories as outlined below Follow-up laboratories as ordered by [facility] to be obtained UA and urine culture to be obtained Proceed with scheduling CT abdomen/pelvis to rule out nephrolithiasis Proceed with scheduling colonoscopy for polyp surveillance Proceed with scheduling surveillance endoscopy for gastric intestinal metaplasia/atrophic gastritis   Orders Placed This Encounter  Procedures   Procedural/ Surgical Case Request: COLONOSCOPY WITH PROPOFOL, ENDOSCOPIC MUCOSAL RESECTION   Urine Culture   CT Abdomen Pelvis W Contrast   CBC   Comp Met (CMET)   Lipase   TSH   B12 and Folate Panel   Urinalysis   Anti-parietal antibody   Gastrin   Intrinsic Factor Antibodies   IBC + Ferritin   Ambulatory referral to Gastroenterology     New Prescriptions   ESOMEPRAZOLE (NEXIUM) 40 MG CAPSULE    Take 1 capsule (40 mg total) by mouth daily at 12 noon.   Modified Medications   No medications on file    Planned Follow Up No follow-ups on file.   Total Time in Face-to-Face and in Coordination of Care for patient including independent/personal interpretation/review of prior testing, medical history, examination, medication adjustment, communicating results with the patient directly, and documentation with the EHR is 45 minutes.  Justice Britain, MD [facility] Office # [phone]

## 2021-02-01 ENCOUNTER — Encounter: Payer: Self-pay | Admitting: Gastroenterology

## 2021-02-01 ENCOUNTER — Ambulatory Visit: Payer: Medicare HMO | Admitting: Gastroenterology

## 2021-02-01 DIAGNOSIS — Z87442 Personal history of urinary calculi: Secondary | ICD-10-CM | POA: Insufficient documentation

## 2021-02-01 DIAGNOSIS — R823 Hemoglobinuria: Secondary | ICD-10-CM | POA: Insufficient documentation

## 2021-02-02 LAB — URINE CULTURE
MICRO NUMBER:: 12191150
SPECIMEN QUALITY:: ADEQUATE

## 2021-02-02 LAB — INTRINSIC FACTOR ANTIBODIES: Intrinsic Factor: POSITIVE — AB

## 2021-02-02 LAB — GASTRIN: Gastrin: 600 pg/mL — ABNORMAL HIGH (ref <=100)

## 2021-02-02 LAB — ANTI-PARIETAL ANTIBODY: PARIETAL CELL AB SCREEN: NEGATIVE

## 2021-02-14 ENCOUNTER — Other Ambulatory Visit: Payer: Self-pay

## 2021-02-14 ENCOUNTER — Ambulatory Visit
Admission: RE | Admit: 2021-02-14 | Discharge: 2021-02-14 | Disposition: A | Discharge status: Home / Self Care | Payer: Medicare Other | Source: Ambulatory Visit | Attending: Gastroenterology | Admitting: Gastroenterology

## 2021-02-14 DIAGNOSIS — K2940 Chronic atrophic gastritis without bleeding: Secondary | ICD-10-CM | POA: Diagnosis present

## 2021-02-14 DIAGNOSIS — R194 Change in bowel habit: Secondary | ICD-10-CM | POA: Insufficient documentation

## 2021-02-14 DIAGNOSIS — K31A0 Gastric intestinal metaplasia, unspecified: Secondary | ICD-10-CM | POA: Insufficient documentation

## 2021-02-14 DIAGNOSIS — M549 Dorsalgia, unspecified: Secondary | ICD-10-CM | POA: Insufficient documentation

## 2021-02-14 DIAGNOSIS — D509 Iron deficiency anemia, unspecified: Secondary | ICD-10-CM | POA: Diagnosis present

## 2021-02-14 MED ORDER — IOHEXOL 300 MG/ML  SOLN
150.0000 mL | Freq: Once | INTRAMUSCULAR | Status: AC | PRN
  Administered 2021-02-14: 100 mL via INTRAVENOUS

## 2021-02-21 NOTE — Progress Notes (Signed)
Attempted to obtain medical history via telephone, unable to reach at this time. I left a voicemail to return pre surgical testing department's phone call. Used [employer] ID # [id number].

## 2021-02-27 ENCOUNTER — Ambulatory Visit (HOSPITAL_COMMUNITY): Payer: Medicare Other | Admitting: Certified Registered Nurse Anesthetist

## 2021-02-27 ENCOUNTER — Encounter (HOSPITAL_COMMUNITY)
Admission: RE | Disposition: A | Discharge status: Home / Self Care | Payer: Self-pay | Source: Home / Self Care | Attending: Gastroenterology

## 2021-02-27 ENCOUNTER — Encounter (HOSPITAL_COMMUNITY): Payer: Self-pay | Admitting: Gastroenterology

## 2021-02-27 ENCOUNTER — Other Ambulatory Visit: Payer: Self-pay

## 2021-02-27 ENCOUNTER — Ambulatory Visit (HOSPITAL_COMMUNITY)
Admission: RE | Admit: 2021-02-27 | Discharge: 2021-02-27 | Disposition: A | Discharge status: Home / Self Care | Payer: Medicare Other | Attending: Gastroenterology | Admitting: Gastroenterology

## 2021-02-27 DIAGNOSIS — K31A0 Gastric intestinal metaplasia, unspecified: Secondary | ICD-10-CM | POA: Insufficient documentation

## 2021-02-27 DIAGNOSIS — K5900 Constipation, unspecified: Secondary | ICD-10-CM | POA: Diagnosis not present

## 2021-02-27 DIAGNOSIS — K5730 Diverticulosis of large intestine without perforation or abscess without bleeding: Secondary | ICD-10-CM | POA: Diagnosis not present

## 2021-02-27 DIAGNOSIS — K635 Polyp of colon: Secondary | ICD-10-CM

## 2021-02-27 DIAGNOSIS — Z79899 Other long term (current) drug therapy: Secondary | ICD-10-CM | POA: Diagnosis not present

## 2021-02-27 DIAGNOSIS — M549 Dorsalgia, unspecified: Secondary | ICD-10-CM

## 2021-02-27 DIAGNOSIS — K3189 Other diseases of stomach and duodenum: Secondary | ICD-10-CM

## 2021-02-27 DIAGNOSIS — K209 Esophagitis, unspecified without bleeding: Secondary | ICD-10-CM | POA: Diagnosis not present

## 2021-02-27 DIAGNOSIS — K449 Diaphragmatic hernia without obstruction or gangrene: Secondary | ICD-10-CM | POA: Insufficient documentation

## 2021-02-27 DIAGNOSIS — I10 Essential (primary) hypertension: Secondary | ICD-10-CM | POA: Insufficient documentation

## 2021-02-27 DIAGNOSIS — K2940 Chronic atrophic gastritis without bleeding: Secondary | ICD-10-CM

## 2021-02-27 DIAGNOSIS — K644 Residual hemorrhoidal skin tags: Secondary | ICD-10-CM | POA: Diagnosis not present

## 2021-02-27 DIAGNOSIS — E785 Hyperlipidemia, unspecified: Secondary | ICD-10-CM | POA: Diagnosis not present

## 2021-02-27 DIAGNOSIS — Z885 Allergy status to narcotic agent status: Secondary | ICD-10-CM | POA: Insufficient documentation

## 2021-02-27 DIAGNOSIS — J45909 Unspecified asthma, uncomplicated: Secondary | ICD-10-CM | POA: Insufficient documentation

## 2021-02-27 DIAGNOSIS — R194 Change in bowel habit: Secondary | ICD-10-CM

## 2021-02-27 DIAGNOSIS — K621 Rectal polyp: Secondary | ICD-10-CM | POA: Diagnosis not present

## 2021-02-27 DIAGNOSIS — K2950 Unspecified chronic gastritis without bleeding: Secondary | ICD-10-CM | POA: Diagnosis not present

## 2021-02-27 DIAGNOSIS — D509 Iron deficiency anemia, unspecified: Secondary | ICD-10-CM

## 2021-02-27 DIAGNOSIS — Z79890 Hormone replacement therapy (postmenopausal): Secondary | ICD-10-CM | POA: Diagnosis not present

## 2021-02-27 DIAGNOSIS — K319 Disease of stomach and duodenum, unspecified: Secondary | ICD-10-CM | POA: Insufficient documentation

## 2021-02-27 DIAGNOSIS — K648 Other hemorrhoids: Secondary | ICD-10-CM | POA: Diagnosis not present

## 2021-02-27 DIAGNOSIS — Z86010 Personal history of colonic polyps: Secondary | ICD-10-CM

## 2021-02-27 DIAGNOSIS — Z09 Encounter for follow-up examination after completed treatment for conditions other than malignant neoplasm: Secondary | ICD-10-CM | POA: Diagnosis present

## 2021-02-27 HISTORY — PX: ESOPHAGOGASTRODUODENOSCOPY (EGD) WITH PROPOFOL: SHX5813

## 2021-02-27 HISTORY — PX: COLONOSCOPY WITH PROPOFOL: SHX5780

## 2021-02-27 HISTORY — PX: POLYPECTOMY: SHX5525

## 2021-02-27 HISTORY — PX: BIOPSY: SHX5522

## 2021-02-27 SURGERY — COLONOSCOPY WITH PROPOFOL
Anesthesia: Monitor Anesthesia Care

## 2021-02-27 MED ORDER — SODIUM CHLORIDE 0.9 % IV SOLN: INTRAVENOUS | Status: DC

## 2021-02-27 MED ORDER — LIDOCAINE 2% (20 MG/ML) 5 ML SYRINGE
INTRAMUSCULAR | Status: DC | PRN
  Administered 2021-02-27: 40 mg via INTRAVENOUS

## 2021-02-27 MED ORDER — ONDANSETRON HCL 4 MG/2ML IJ SOLN
INTRAMUSCULAR | Status: DC | PRN
  Administered 2021-02-27: 4 mg via INTRAVENOUS

## 2021-02-27 MED ORDER — PHENYLEPHRINE 40 MCG/ML (10ML) SYRINGE FOR IV PUSH (FOR BLOOD PRESSURE SUPPORT)
PREFILLED_SYRINGE | INTRAVENOUS | Status: DC | PRN
  Administered 2021-02-27 (×2): 40 ug via INTRAVENOUS

## 2021-02-27 MED ORDER — PROPOFOL 10 MG/ML IV BOLUS
INTRAVENOUS | Status: DC | PRN
  Administered 2021-02-27 (×2): 10 mg via INTRAVENOUS

## 2021-02-27 MED ORDER — PROPOFOL 500 MG/50ML IV EMUL
INTRAVENOUS | Status: DC | PRN
  Administered 2021-02-27: 125 ug/kg/min via INTRAVENOUS

## 2021-02-27 MED ORDER — PROPOFOL 1000 MG/100ML IV EMUL
INTRAVENOUS | Status: AC
  Filled 2021-02-27: qty 100

## 2021-02-27 MED ORDER — LACTATED RINGERS IV SOLN
INTRAVENOUS | Status: AC | PRN
  Administered 2021-02-27: 1000 mL via INTRAVENOUS

## 2021-02-27 SURGICAL SUPPLY — 24 items

## 2021-02-27 NOTE — Anesthesia Preprocedure Evaluation (Signed)
Anesthesia Evaluation  Patient identified by MRN, date of birth, ID band Patient awake    Reviewed: Allergy & Precautions, NPO status , Patient's Chart, lab work & pertinent test results  Airway Mallampati: II  TM Distance: >3 FB Neck ROM: Full    Dental no notable dental hx.    Pulmonary asthma ,    Pulmonary exam normal breath sounds clear to auscultation       Cardiovascular hypertension, Pt. on medications Normal cardiovascular exam Rhythm:Regular Rate:Normal     Neuro/Psych negative neurological ROS  negative psych ROS   GI/Hepatic negative GI ROS, Neg liver ROS,   Endo/Other  Hypothyroidism   Renal/GU negative Renal ROS  negative genitourinary   Musculoskeletal  (+) Arthritis , Rheumatoid disorders,    Abdominal   Peds negative pediatric ROS (+)  Hematology negative hematology ROS (+)   Anesthesia Other Findings   Reproductive/Obstetrics negative OB ROS                             Anesthesia Physical Anesthesia Plan  ASA: 3  Anesthesia Plan: MAC   Post-op Pain Management:    Induction: Intravenous  PONV Risk Score and Plan: 2 and Propofol infusion and Treatment may vary due to age or medical condition  Airway Management Planned: Simple Face Mask  Additional Equipment:   Intra-op Plan:   Post-operative Plan:   Informed Consent: I have reviewed the patients History and Physical, chart, labs and discussed the procedure including the risks, benefits and alternatives for the proposed anesthesia with the patient or authorized representative who has indicated his/her understanding and acceptance.     Dental advisory given  Plan Discussed with: CRNA and Surgeon  Anesthesia Plan Comments:         Anesthesia Quick Evaluation

## 2021-02-27 NOTE — Interval H&P Note (Signed)
History and Physical Interval Note:  02/27/2021 10:41 AM  [facility]  has presented today for surgery, with the diagnosis of IDA, TVA, Rectosigmoid Intestine Metaplasia, Gastriitis, Change in Bowel habit, CVA, Back Pain.  The various methods of treatment have been discussed with the patient and family. After consideration of risks, benefits and other options for treatment, the patient has consented to  Procedure(s): COLONOSCOPY WITH PROPOFOL (N/A) ENDOSCOPIC MUCOSAL RESECTION (N/A) ESOPHAGOGASTRODUODENOSCOPY (EGD) WITH PROPOFOL (N/A) as a surgical intervention.  The patient's history has been reviewed, patient examined, no change in status, stable for surgery.  I have reviewed the patient's chart and labs.  Questions were answered to the patient's satisfaction.     [employer]

## 2021-02-27 NOTE — Discharge Instructions (Signed)
YOU HAD AN ENDOSCOPIC PROCEDURE TODAY: Refer to the procedure report and other information in the discharge instructions given to you for any specific questions about what was found during the examination. If this information does not answer your questions, please call [place] office at [phone] to clarify.  ° °YOU SHOULD EXPECT: Some feelings of bloating in the abdomen. Passage of more gas than usual. Walking can help get rid of the air that was put into your GI tract during the procedure and reduce the bloating. If you had a lower endoscopy (such as a colonoscopy or flexible sigmoidoscopy) you may notice spotting of blood in your stool or on the toilet paper. Some abdominal soreness may be present for a day or two, also. ° °DIET: Your first meal following the procedure should be a light meal and then it is ok to progress to your normal diet. A half-sandwich or bowl of soup is an example of a good first meal. Heavy or fried foods are harder to digest and may make you feel nauseous or bloated. Drink plenty of fluids but you should avoid alcoholic beverages for 24 hours. If you had a esophageal dilation, please see attached instructions for diet.   ° °ACTIVITY: Your care partner should take you home directly after the procedure. You should plan to take it easy, moving slowly for the rest of the day. You can resume normal activity the day after the procedure however YOU SHOULD NOT DRIVE, use power tools, machinery or perform tasks that involve climbing or major physical exertion for 24 hours (because of the sedation medicines used during the test).  ° °SYMPTOMS TO REPORT IMMEDIATELY: °A gastroenterologist can be reached at any hour. Please call [phone]  for any of the following symptoms:  °Following lower endoscopy (colonoscopy, flexible sigmoidoscopy) °Excessive amounts of blood in the stool  °Significant tenderness, worsening of abdominal pains  °Swelling of the abdomen that is new, acute  °Fever of 100° or  higher  °Following upper endoscopy (EGD, EUS, ERCP, esophageal dilation) °Vomiting of blood or coffee ground material  °New, significant abdominal pain  °New, significant chest pain or pain under the shoulder blades  °Painful or persistently difficult swallowing  °New shortness of breath  °Black, tarry-looking or red, bloody stools ° °FOLLOW UP:  °If any biopsies were taken you will be contacted by phone or by letter within the next 1-3 weeks. Call [phone]  if you have not heard about the biopsies in 3 weeks.  °Please also call with any specific questions about appointments or follow up tests. ° °

## 2021-02-27 NOTE — Op Note (Signed)
[facility] Patient Name: Carrie Bryant Procedure Date: 02/27/2021 MRN: [id number] Attending MD: Justice Britain , MD Date of Birth: 04-Jun-1951 CSN: [id number] Age: 70 Admit Type: Inpatient Procedure:                Upper GI endoscopy Indications:              Intestinal metaplasia, Follow-up of intestinal                            metaplasia, Atrophic Gastritis Providers:                Justice Britain, MD, Kary Kos RN, RN, Elspeth Cho Tech., Technician, Virgia Land, CRNA Referring MD:             Elyse Jarvis Revelo, Lennette Bihari Bonna Gains MD, MD Medicines:                Monitored Anesthesia Care Complications:            No immediate complications. Estimated Blood Loss:     Estimated blood loss was minimal. Procedure:                Pre-Anesthesia Assessment:                           - Prior to the procedure, a History and Physical                            was performed, and patient medications and                            allergies were reviewed. The patient's tolerance of                            previous anesthesia was also reviewed. The risks                            and benefits of the procedure and the sedation                            options and risks were discussed with the patient.                            All questions were answered, and informed consent                            was obtained. Prior Anticoagulants: The patient has                            taken no previous anticoagulant or antiplatelet                            agents. ASA Grade Assessment: III - A patient with  severe systemic disease. After reviewing the risks                            and benefits, the patient was deemed in                            satisfactory condition to undergo the procedure.                           After obtaining informed consent, the endoscope was                             passed under direct vision. Throughout the                            procedure, the patient's blood pressure, pulse, and                            oxygen saturations were monitored continuously. The                            [id number]-[id number] Olympus endoscope was introduced                            through the mouth, and advanced to the second part                            of duodenum. The upper GI endoscopy was                            accomplished without difficulty. The patient                            tolerated the procedure. Scope In: 12:00:06 PM Scope Out: 12:23:06 PM Scope Withdrawal Time: 0 hours 19 minutes 24 seconds  Total Procedure Duration: 0 hours 23 minutes 0 seconds  Findings:      No gross lesions were noted in the proximal esophagus and in the mid       esophagus.      An island of salmon-colored mucosa was present at 33 cm. No other       visible abnormalities were present. Biopsies were taken with a cold       forceps for histology.      The Z-line was irregular and was found 34 cm from the incisors.      A 3 cm hiatal hernia was present.      Patchy mildly erythematous mucosa without bleeding was found in the       entire examined stomach. Due to history of Intestinal Metaplasia,       gastric mapping was performed. Biopsies were taken with a cold forceps       for histology from the antrum. Biopsies were taken with a cold forceps       for histology from the incisura. Biopsies were taken with a cold forceps       for histology from the lesser curve. Biopsies were taken with a cold  forceps for histology from the greater curve. Biopsies were taken with a       cold forceps for histology from the fundus/cardia.      No gross lesions were noted in the duodenal bulb, in the first portion       of the duodenum and in the second portion of the duodenum. Impression:               - No gross lesions in esophagus proximally.                             Salmon-colored mucosa island suggestive of                            Barrett's esophagus distally - biopsied.                           - Z-line irregular, 34 cm from the incisors.                           - 3 cm hiatal hernia.                           - Erythematous mucosa in the stomach. Gastric                            Mapping was performed due to history of Intestinal                            Metaplasia - biopsied.                           - No gross lesions in the duodenal bulb, in the                            first portion of the duodenum and in the second                            portion of the duodenum. Moderate Sedation:      Not Applicable - Patient had care per Anesthesia. Recommendation:           - Proceed to scheduled colonoscopy.                           - Continue present medications.                           - Await pathology results.                           - Repeat upper endoscopy in likely 3 years for                            surveillance from Gastric Intestinal Metaplasia.                           - The findings  and recommendations were discussed                            with the patient.                           - The findings and recommendations were discussed                            with the patient's family. Procedure Code(s):        --- Professional ---                           [id number], Esophagogastroduodenoscopy, flexible,                            transoral; with biopsy, single or multiple Diagnosis Code(s):        --- Professional ---                           K22.8, Other specified diseases of esophagus                           K44.9, Diaphragmatic hernia without obstruction or                            gangrene                           K31.89, Other diseases of stomach and duodenum CPT copyright 2019 [employer]. All rights reserved. The codes documented in this report are preliminary and upon coder review may   be revised to meet current compliance requirements. Justice Britain, MD 02/27/2021 12:51:55 PM Number of Addenda: 0

## 2021-02-27 NOTE — Transfer of Care (Signed)
Immediate Anesthesia Transfer of Care Note  Patient: Carrie Bryant  Procedure(s) Performed: COLONOSCOPY WITH PROPOFOL ESOPHAGOGASTRODUODENOSCOPY (EGD) WITH PROPOFOL BIOPSY POLYPECTOMY  Patient Location: PACU and Endoscopy Unit  Anesthesia Type:MAC  Level of Consciousness: awake, alert  and oriented  Airway & Oxygen Therapy: Patient Spontanous Breathing and Patient connected to face mask oxygen  Post-op Assessment: Report given to RN and Post -op Vital signs reviewed and stable  Post vital signs: Reviewed and stable  Last Vitals:  Vitals Value Taken Time  BP    Temp    Pulse 68 02/27/21 1233  Resp 24 02/27/21 1233  SpO2 100 % 02/27/21 1233  Vitals shown include unvalidated device data.  Last Pain:  Vitals:   02/27/21 1128  TempSrc: Oral  PainSc: 0-No pain         Complications: No notable events documented.

## 2021-02-27 NOTE — Op Note (Signed)
[facility] Patient Name: Carrie Bryant Procedure Date: 02/27/2021 MRN: [id number] Attending MD: Justice Britain , MD Date of Birth: [birth year] CSN: [id number] Age: 70 Admit Type: Inpatient Procedure:                Colonoscopy Indications:              Surveillance: Personal history of piecemeal removal                            of adenoma on last colonoscopy (less than 1 year                            ago) Providers:                Justice Britain, MD, Kary Kos RN, RN, Elspeth Cho Tech., Technician, Virgia Land, CRNA Referring MD:             Elyse Jarvis Revelo, Lennette Bihari. Bonna Gains MD, MD Medicines:                Monitored Anesthesia Care Complications:            No immediate complications. Estimated Blood Loss:     Estimated blood loss was minimal. Procedure:                Pre-Anesthesia Assessment:                           - Prior to the procedure, a History and Physical                            was performed, and patient medications and                            allergies were reviewed. The patient's tolerance of                            previous anesthesia was also reviewed. The risks                            and benefits of the procedure and the sedation                            options and risks were discussed with the patient.                            All questions were answered, and informed consent                            was obtained. Prior Anticoagulants: The patient has                            taken no previous anticoagulant or antiplatelet  agents. ASA Grade Assessment: III - A patient with                            severe systemic disease. After reviewing the risks                            and benefits, the patient was deemed in                            satisfactory condition to undergo the procedure.                           After obtaining informed  consent, the colonoscope                            was passed under direct vision. Throughout the                            procedure, the patient's blood pressure, pulse, and                            oxygen saturations were monitored continuously. The                            [id number]-[id number] ([id number]) Olympus colonoscope was                            introduced through the anus and advanced to the 5                            cm into the ileum. The colonoscopy was performed                            without difficulty. The patient tolerated the                            procedure. The quality of the bowel preparation was                            good. The terminal ileum, ileocecal valve,                            appendiceal orifice, and rectum were photographed. Scope In: Scope Out: Findings:      The digital rectal exam findings include hemorrhoids. Pertinent       negatives include no palpable rectal lesions.      The terminal ileum and ileocecal valve appeared normal.      Four sessile polyps were found in the rectum (1), descending colon (1),       transverse colon (1), and cecum (1). The polyps were 2 to 4 mm in size.       These polyps were removed with a cold snare. Resection and retrieval       were complete.      A few small-mouthed diverticula were found in the  recto-sigmoid colon       and sigmoid colon.      A large post mucosectomy scar was found in the mid rectum. There was       residual polypoid tissue. This was biopsied with a cold snare for       histology.      Normal mucosa was found in the entire colon otherwise.      Non-bleeding non-thrombosed external and internal hemorrhoids were found       during retroflexion, during perianal exam and during digital exam. The       hemorrhoids were Grade II (internal hemorrhoids that prolapse but reduce       spontaneously). Impression:               - Hemorrhoids found on digital rectal exam.                            - The examined portion of the ileum was normal.                           - Four, 2 to 4 mm polyps in the rectum, in the                            descending colon, in the transverse colon and in                            the cecum, removed with a cold snare. Resected and                            retrieved.                           - Diverticulosis in the recto-sigmoid colon and in                            the sigmoid colon.                           - Post mucosectomy scar in the mid rectum. Biopsied.                           - Normal mucosa in the entire examined colon                            otherwise.                           - Non-bleeding non-thrombosed external and internal                            hemorrhoids. Moderate Sedation:      Not Applicable - Patient had care per Anesthesia. Recommendation:           - The patient will be observed post-procedure,                            until all discharge criteria are  met.                           - Discharge patient to home.                           - Patient has a contact number available for                            emergencies. The signs and symptoms of potential                            delayed complications were discussed with the                            patient. Return to normal activities tomorrow.                            Written discharge instructions were provided to the                            patient.                           - High fiber diet.                           - Continue present medications.                           - Await pathology results.                           - Repeat colonoscopy for surveillance based on                            pathology results. If there is no evidence of                            recurrent adenoma in the scar site biopsies, then                            recommend a follow up full colonoscopy in 3-years.                            If there is  evidence of recurrent adenoma then                            repeat Flexible Sigmoidoscopy/Colonoscopy in 1-year                           - The findings and recommendations were discussed                            with the patient.                           -  The findings and recommendations were discussed                            with the patient's family. Procedure Code(s):        --- Professional ---                           [phone], Colonoscopy, flexible; with removal of                            tumor(s), polyp(s), or other lesion(s) by snare                            technique Diagnosis Code(s):        --- Professional ---                           K64.1, Second degree hemorrhoids                           K62.1, Rectal polyp                           K63.5, Polyp of colon                           Z98.890, Other specified postprocedural states                           Z09, Encounter for follow-up examination after                            completed treatment for conditions other than                            malignant neoplasm                           Z86.010, Personal history of colonic polyps                           K57.30, Diverticulosis of large intestine without                            perforation or abscess without bleeding CPT copyright 2019 American [employer] Association. All rights reserved. The codes documented in this report are preliminary and upon coder review may  be revised to meet current compliance requirements. Justice Britain, MD 02/27/2021 12:46:18 PM Number of Addenda: 0

## 2021-02-27 NOTE — Anesthesia Postprocedure Evaluation (Signed)
Anesthesia Post Note  Patient: Carrie Bryant  Procedure(s) Performed: COLONOSCOPY WITH PROPOFOL ESOPHAGOGASTRODUODENOSCOPY (EGD) WITH PROPOFOL BIOPSY POLYPECTOMY     Patient location during evaluation: PACU Anesthesia Type: MAC Level of consciousness: awake and alert Pain management: pain level controlled Vital Signs Assessment: post-procedure vital signs reviewed and stable Respiratory status: spontaneous breathing, nonlabored ventilation, respiratory function stable and patient connected to nasal cannula oxygen Cardiovascular status: stable and blood pressure returned to baseline Postop Assessment: no apparent nausea or vomiting Anesthetic complications: no   No notable events documented.  Last Vitals:  Vitals:   02/27/21 1128 02/27/21 1234  BP: 124/76 97/62  Pulse: 72 69  Resp: (!) 25 (!) 24  Temp: 36.6 C   SpO2: 93% 99%    Last Pain:  Vitals:   02/27/21 1234  TempSrc:   PainSc: 0-No pain                 Numan Zylstra S

## 2021-02-27 NOTE — Anesthesia Procedure Notes (Signed)
Procedure Name: MAC Date/Time: 02/27/2021 11:34 AM Performed by: Maxwell Caul, CRNA Pre-anesthesia Checklist: Patient identified, Emergency Drugs available, Suction available and Patient being monitored Patient Re-evaluated:Patient Re-evaluated prior to induction Oxygen Delivery Method: Simple face mask

## 2021-02-28 ENCOUNTER — Telehealth: Payer: Self-pay

## 2021-02-28 DIAGNOSIS — D509 Iron deficiency anemia, unspecified: Secondary | ICD-10-CM

## 2021-02-28 NOTE — Telephone Encounter (Signed)
Called patient to let her know that she s needing to see a hematologist due to her anemia. Patient understood and had no further questions.

## 2021-02-28 NOTE — Telephone Encounter (Signed)
-----   Message from Lurlean Nanny, [place] sent at 02/16/2021 11:27 AM EDT ----- Needs spanish speaking

## 2021-03-02 LAB — SURGICAL PATHOLOGY

## 2021-03-03 ENCOUNTER — Encounter: Payer: Self-pay | Admitting: Gastroenterology

## 2021-03-16 ENCOUNTER — Other Ambulatory Visit: Payer: Medicare Other

## 2021-03-16 ENCOUNTER — Encounter: Payer: Medicare Other | Admitting: Oncology

## 2021-03-30 ENCOUNTER — Other Ambulatory Visit: Payer: Self-pay

## 2021-03-30 ENCOUNTER — Encounter: Payer: Medicare Other | Admitting: Oncology

## 2021-07-19 ENCOUNTER — Other Ambulatory Visit: Payer: Self-pay | Admitting: Family Medicine

## 2021-07-19 DIAGNOSIS — N200 Calculus of kidney: Secondary | ICD-10-CM

## 2021-07-24 ENCOUNTER — Ambulatory Visit
Admission: RE | Admit: 2021-07-24 | Discharge: 2021-07-24 | Disposition: A | Discharge status: Home / Self Care | Payer: Medicare Other | Source: Ambulatory Visit | Attending: Family Medicine | Admitting: Family Medicine

## 2021-07-24 DIAGNOSIS — N200 Calculus of kidney: Secondary | ICD-10-CM | POA: Diagnosis present

## 2021-08-01 ENCOUNTER — Ambulatory Visit: Payer: Medicare Other | Admitting: Urology

## 2021-08-01 NOTE — Progress Notes (Signed)
08/02/21 3:06 PM   [place] 06/13/1951 [id number]  Referring provider:  Theotis Burrow, MD [address] [place],  [place] [address] Chief Complaint  Patient presents with   Nephrolithiasis     HPI: Carrie Bryant is a 71 y.o.female who presents today for further evaluation of calculus on kidney.   She underwent a CT abdomen and pelvis on 02/14/2021 for iron deficiency and changes in bowel habits. This revealed 1.8 cm low-attenuation lesion in lower pole of left kidney remains stable, and could represent a proteinaceous cyst or solid renal mass. Abdomen MRI without and with contrast recommended for further characterization.  Repeat CT abdomen and pelvis without contrast revealed Cortical scarring LEFT kidney. Multiple BILATERAL renal cysts. Tiny BILATERAL nonobstructing renal calculi, largest mid RIGHT kidney 4 mm diameter. No hydronephrosis, ureteral calcification or ureteral dilatation. Tiny parenchymal calcification at cortex of LEFT kidney posteriorly. Bladder unremarkable.  She is accompanied by a spanish interpreter today. She reports that she has difficulty urinating, she has pain across her pelvis and low back when she does urinate. She reports that after urination she has burning in her urethra. She reports that she had kidney stones in the past. Her pain has been ongoing for around 2 months. She wears panty liners because she feels like she is leaking. She has increased urinary frequency. She denies any blood in her urine.   She has had constipation.  She reports that she had a laser treatment and 3 others times she passed her stones with medication. The last time she had a stone was years ago.   PMH: Past Medical History:  Diagnosis Date   Anemia    Anxiety    Arthritis    Asthma    Depression    History of kidney stones    Hypercholesterolemia    Hypertension    Kidney stones    Thyroid disease     Surgical History: Past Surgical  History:  Procedure Laterality Date   ABDOMINAL HYSTERECTOMY     BIOPSY  07/18/2020   Procedure: BIOPSY;  Surgeon: Irving Copas., MD;  Location: [facility];  Service: Gastroenterology;;   BIOPSY  02/27/2021   Procedure: BIOPSY;  Surgeon: Irving Copas., MD;  Location: [facility];  Service: Gastroenterology;;   COLONOSCOPY WITH PROPOFOL N/A 05/02/2020   Procedure: COLONOSCOPY WITH BIOPSY;  Surgeon: Virgel Manifold, MD;  Location: [facility];  Service: Endoscopy;  Laterality: N/A;  PRIORITY 4   COLONOSCOPY WITH PROPOFOL N/A 07/18/2020   Procedure: COLONOSCOPY WITH PROPOFOL;  Surgeon: Rush Landmark Telford Nab., MD;  Location: [facility];  Service: Gastroenterology;  Laterality: N/A;   COLONOSCOPY WITH PROPOFOL N/A 02/27/2021   Procedure: COLONOSCOPY WITH PROPOFOL;  Surgeon: Rush Landmark Telford Nab., MD;  Location: [facility];  Service: Gastroenterology;  Laterality: N/A;   ENDOSCOPIC MUCOSAL RESECTION N/A 07/18/2020   Procedure: ENDOSCOPIC MUCOSAL RESECTION;  Surgeon: Rush Landmark Telford Nab., MD;  Location: [facility];  Service: Gastroenterology;  Laterality: N/A;   ESOPHAGOGASTRODUODENOSCOPY (EGD) WITH PROPOFOL N/A 07/18/2020   Procedure: ESOPHAGOGASTRODUODENOSCOPY (EGD) WITH PROPOFOL;  Surgeon: Rush Landmark Telford Nab., MD;  Location: [facility];  Service: Gastroenterology;  Laterality: N/A;   ESOPHAGOGASTRODUODENOSCOPY (EGD) WITH PROPOFOL N/A 02/27/2021   Procedure: ESOPHAGOGASTRODUODENOSCOPY (EGD) WITH PROPOFOL;  Surgeon: Rush Landmark Telford Nab., MD;  Location: [facility];  Service: Gastroenterology;  Laterality: N/A;   HEMOSTASIS CLIP PLACEMENT  07/18/2020   Procedure: HEMOSTASIS CLIP PLACEMENT;  Surgeon: Rush Landmark Telford Nab., MD;  Location: [facility];  Service:  Gastroenterology;;   POLYPECTOMY N/A 05/02/2020   Procedure: POLYPECTOMY;  Surgeon: Virgel Manifold, MD;  Location: [facility];  Service: Endoscopy;  Laterality: N/A;   POLYPECTOMY   07/18/2020   Procedure: POLYPECTOMY;  Surgeon: Rush Landmark Telford Nab., MD;  Location: [facility];  Service: Gastroenterology;;   POLYPECTOMY  02/27/2021   Procedure: POLYPECTOMY;  Surgeon: Irving Copas., MD;  Location: [facility];  Service: Gastroenterology;;   SUBMUCOSAL LIFTING INJECTION  07/18/2020   Procedure: SUBMUCOSAL LIFTING INJECTION;  Surgeon: Irving Copas., MD;  Location: [facility];  Service: Gastroenterology;;    Home Medications:  Allergies as of 08/02/2021       Reactions   Morphine And Related Anaphylaxis, Other (See Comments)   dizziness   Codeine Anxiety, Other (See Comments)   Hypertension (elevates BP) & chest pain.        Medication List        Accurate as of August 02, 2021  3:06 PM. If you have any questions, ask your nurse or doctor.          albuterol 108 (90 Base) MCG/ACT inhaler Commonly known as: VENTOLIN HFA Inhale 2 puffs into the lungs every 4 (four) hours as needed for wheezing or shortness of breath.   clonazePAM 2 MG tablet Commonly known as: KLONOPIN Take 2 mg by mouth at bedtime.   esomeprazole 40 MG capsule Commonly known as: NexIUM Take 1 capsule (40 mg total) by mouth daily at 12 noon.   ferrous sulfate 325 (65 FE) MG tablet Take 325 mg by mouth daily.   fluticasone 50 MCG/ACT nasal spray Commonly known as: FLONASE Place 2 sprays into both nostrils 2 (two) times daily.   furosemide 20 MG tablet Commonly known as: LASIX Take 20 mg by mouth daily.   ketorolac 10 MG tablet Commonly known as: TORADOL Take 1 tablet (10 mg total) by mouth every 6 (six) hours as needed.   levothyroxine 25 MCG tablet Commonly known as: SYNTHROID Take 25 mcg by mouth daily before breakfast.   montelukast 10 MG tablet Commonly known as: SINGULAIR Take 10 mg by mouth at bedtime.   multivitamin tablet Take 1 tablet by mouth daily.   ondansetron 8 MG disintegrating tablet Commonly known as: Zofran ODT Take 1 tablet  (8 mg total) by mouth every 8 (eight) hours as needed for nausea or vomiting.   simvastatin 20 MG tablet Commonly known as: ZOCOR Take 20 mg by mouth every evening.   tamsulosin 0.4 MG Caps capsule Commonly known as: FLOMAX Take 1 capsule (0.4 mg total) by mouth daily.   traZODone 150 MG tablet Commonly known as: DESYREL Take 150 mg by mouth at bedtime.   valsartan 80 MG tablet Commonly known as: DIOVAN Take 80 mg by mouth daily.   vitamin B-12 1000 MCG tablet Commonly known as: CYANOCOBALAMIN Take 1,000 mcg by mouth daily.   Vitamin D-3 25 MCG (1000 UT) Caps Take 1,000 Units by mouth daily.        Allergies:  Allergies  Allergen Reactions   Morphine And Related Anaphylaxis and Other (See Comments)    dizziness   Codeine Anxiety and Other (See Comments)    Hypertension (elevates BP) & chest pain.    Family History: Family History  Problem Relation Age of Onset   Colon cancer Neg Hx    Esophageal cancer Neg Hx    Inflammatory bowel disease Neg Hx    Liver disease Neg Hx    Pancreatic cancer Neg Hx  Rectal cancer Neg Hx    Stomach cancer Neg Hx     Social History:  reports that she has never smoked. She has never used smokeless tobacco. She reports that she does not drink alcohol and does not use drugs.   Physical Exam: BP (!) 152/84    Pulse 61    Ht 5' (1.524 m)    Wt 149 lb (67.6 kg)    BMI 29.10 kg/m   Constitutional:  Alert and oriented, No acute distress.  Accompanied by [place] translator today.   HEENT: [place] AT, moist mucus membranes.  Trachea midline, no masses. Cardiovascular: No clubbing, cyanosis, or edema. Respiratory: Normal respiratory effort, no increased work of breathing. Skin: No rashes, bruises or suspicious lesions. Neurologic: Grossly intact, no focal deficits, moving all 4 extremities. Psychiatric: Normal mood and affect.  Laboratory Data:  Lab Results  Component Value Date   CREATININE 0.74 01/31/2021    Urinalysis Results  for orders placed or performed in visit on 08/02/21  Mycoplasma / Ureaplasma Culture   Specimen: Genital   UR  Result Value Ref Range   Ureaplasma urealyticum Comment Negative   Mycoplasma hominis Culture Comment Negative  Microscopic Examination   Urine  Result Value Ref Range   WBC, UA 6-10 (A) 0 - 5 /hpf   RBC 0-2 0 - 2 /hpf   Epithelial Cells (non renal) 0-10 0 - 10 /hpf   Casts Present (A) None seen /lpf   Cast Type Hyaline casts N/A   Bacteria, UA None seen None seen/Few  Urinalysis, Complete  Result Value Ref Range   Specific Gravity, UA 1.015 1.005 - 1.030   pH, UA 5.5 5.0 - 7.5   Color, UA Yellow Yellow   Appearance Ur Clear Clear   Leukocytes,UA Trace (A) Negative   Protein,UA Negative Negative/Trace   Glucose, UA Negative Negative   Ketones, UA Negative Negative   RBC, UA 1+ (A) Negative   Bilirubin, UA Negative Negative   Urobilinogen, Ur 0.2 0.2 - 1.0 mg/dL   Nitrite, UA Negative Negative   Microscopic Examination See below:      Pertinent Imaging: CLINICAL DATA:  Iron deficiency anemia. Change in bowel habits. Nausea. Nephrolithiasis. Previous resection of rectal villous adenoma.   EXAM: CT ABDOMEN AND PELVIS WITH CONTRAST   TECHNIQUE: Multidetector CT imaging of the abdomen and pelvis was performed using the standard protocol following bolus administration of intravenous contrast.   CONTRAST:  151mL OMNIPAQUE IOHEXOL 300 MG/ML  SOLN   COMPARISON:  05/27/2020 from [facility]   FINDINGS: Lower Chest: No acute findings.   Hepatobiliary: No hepatic masses identified. Small cyst again seen in the left hepatic lobe. Gallbladder is unremarkable. No evidence of biliary ductal dilatation.   Pancreas:  No mass or inflammatory changes.   Spleen: Within normal limits in size and appearance.   Adrenals/Urinary Tract: Normal adrenal glands. Stable mild left renal parenchymal scarring. Multiple small cysts are again seen in both  kidneys. A 1.8 cm low-attenuation lesion is again seen in the lower pole of the left kidney which is unchanged since previous study, but could represent a proteinaceous cyst or solid renal mass. No evidence of ureteral calculi or hydronephrosis. Unremarkable unopacified urinary bladder.   Stomach/Bowel: No evidence of obstruction, inflammatory process or abnormal fluid collections. No masses identified.   Vascular/Lymphatic: No pathologically enlarged lymph nodes. No acute vascular findings. Aortic atherosclerotic calcification noted.   Reproductive: Tiny approximately 1 cm left posterior uterine fibroid again seen.  Adnexal regions are unremarkable.   Other:  None.   Musculoskeletal:  No suspicious bone lesions identified.   IMPRESSION: No acute findings.   1.8 cm low-attenuation lesion in lower pole of left kidney remains stable, and could represent a proteinaceous cyst or solid renal mass. Abdomen MRI without and with contrast recommended for further characterization.   Aortic Atherosclerosis (ICD10-I70.0).     Electronically Signed   By: Marlaine Hind M.D.   On: 02/14/2021 16:53 CLINICAL DATA:  Nephrolithiasis, history hypertension   EXAM: CT ABDOMEN AND PELVIS WITHOUT CONTRAST   TECHNIQUE: Multidetector CT imaging of the abdomen and pelvis was performed following the standard protocol without IV contrast.   RADIATION DOSE REDUCTION: This exam was performed according to the departmental dose-optimization program which includes automated exposure control, adjustment of the mA and/or kV according to patient size and/or use of iterative reconstruction technique.   COMPARISON:  02/14/2021   FINDINGS: Lower chest: Subsegmental atelectasis at both lung bases. Calcified granuloma LEFT lower lobe.   Hepatobiliary: Cyst anterolateral segment LEFT lobe liver 1.5 x 1.3 cm unchanged. Gallbladder and liver otherwise normal appearance.   Pancreas: Normal appearance    Spleen: Normal appearance   Adrenals/Urinary Tract: Adrenal glands normal appearance. Cortical scarring LEFT kidney. Multiple BILATERAL renal cysts. Tiny BILATERAL nonobstructing renal calculi, largest mid RIGHT kidney 4 mm diameter. No hydronephrosis, ureteral calcification or ureteral dilatation. Tiny parenchymal calcification at cortex of LEFT kidney posteriorly. Bladder unremarkable.   Stomach/Bowel: Normal appendix. Stomach and bowel loops normal appearance.   Vascular/Lymphatic: Atherosclerotic calcification aorta without aneurysm. No adenopathy.   Reproductive: Unremarkable uterus and ovaries   Other: No free air or free fluid. No hernia or inflammatory process.   Musculoskeletal: Demineralized with minimal chronic superior endplate deformity of O84 unchanged.   IMPRESSION: Multiple BILATERAL renal cysts and tiny BILATERAL nonobstructing renal calculi.   Cortical scarring LEFT kidney.   Small hepatic cyst.   No acute intra-abdominal or intrapelvic abnormalities.   Aortic Atherosclerosis (ICD10-I70.0).     Electronically Signed   By: Lavonia Dana M.D.   On: 07/25/2021 11:25   CT scan images was personally reviewed.  Minimal bilateral stone burden, possibly even intraparenchymal.  Assessment & Plan:    Pelvic pain/ Dysuria   - Upon review of imagine discussed that pain is not likely associated with a stone  - Will send urine for urine cytology, mycoplasma and ureaplasma.  - Recommend pelvic exam and cystoscopy to evaluate the anatomy of the bladder   2. History of kidney stones  - Recent imagine reassuring  - pelvic pain not likely caused by stone   3. Chronic constipation - Discussed how constipation can cause urinary symptoms  - Recommend she use Miralax when she does not have a bowel movement   Return for cystoscopy and pelvic exam   I,Kailey Littlejohn,acting as a scribe for Hollice Espy, MD.,have documented all relevant documentation on the  behalf of Hollice Espy, MD,as directed by  Hollice Espy, MD while in the presence of Hollice Espy, MD.  I have reviewed the above documentation for accuracy and completeness, and I agree with the above.   Hollice Espy, MD   [facility] [address], [place], [place] [address] [phone]

## 2021-08-02 ENCOUNTER — Other Ambulatory Visit: Payer: Self-pay

## 2021-08-02 ENCOUNTER — Encounter: Payer: Self-pay | Admitting: Urology

## 2021-08-02 ENCOUNTER — Ambulatory Visit (INDEPENDENT_AMBULATORY_CARE_PROVIDER_SITE_OTHER): Payer: Medicare Other | Admitting: Urology

## 2021-08-02 VITALS — BP 152/84 | HR 61 | Ht 60.0 in | Wt 149.0 lb

## 2021-08-02 DIAGNOSIS — R102 Pelvic and perineal pain: Secondary | ICD-10-CM

## 2021-08-02 DIAGNOSIS — R300 Dysuria: Secondary | ICD-10-CM | POA: Diagnosis not present

## 2021-08-02 DIAGNOSIS — K5909 Other constipation: Secondary | ICD-10-CM

## 2021-08-02 DIAGNOSIS — Z87442 Personal history of urinary calculi: Secondary | ICD-10-CM

## 2021-08-02 NOTE — Patient Instructions (Signed)
Cistoscopia Cystoscopy La cistoscopia es un procedimiento que se utiliza para ayudar a Retail buyer y, a Clinical cytogeneticist, tratar afecciones que afectan las vas urinarias inferiores. Las vas urinarias inferiores incluyen la vejiga y Geologist, engineering. La uretra es el conducto por el que drena la orina de la vejiga. La cistoscopia se hace con un instrumento fino en forma de tubo con Ardelia Mems luz y una cmara en el extremo (cistoscopio). El cistoscopio puede ser duro o flexible, segn el objetivo del procedimiento. El cistoscopio se inserta por la uretra e ingresa a la vejiga. La cistoscopia se puede recomendar en los siguientes casos: Infecciones de las vas urinarias que se repiten. Sangre en la orina (hematuria). Incapacidad para controlar la orina (incontinencia urinaria) o vejiga hiperactiva. Clulas inusuales que se encuentran en Truddie Coco de [place]. Una obstruccin en la uretra, como un clculo urinario. Dolor al [place]. Una anormalidad en la vejiga que se encuentra durante una pielografa intravenosa (PIV) o exploracin por tomografa computarizada (TC). La cistoscopia tambin puede realizarse para extraer Truddie Coco de tejido para examinarla con un microscopio (biopsia). Informe al mdico acerca de lo siguiente: Cualquier alergia que tenga. Todos los [employer] [place], incluidos vitaminas, hierbas, gotas oftlmicas, cremas y medicamentos de venta libre. Problemas previos que usted o algn miembro de su familia hayan tenido con los anestsicos. Cualquier trastorno de la sangre que tenga. Cirugas a las que se haya sometido. Cualquier afeccin mdica que tenga. Si est embarazada o podra estarlo. Cules son los riesgos? En general, se trata de un procedimiento seguro. Sin embargo, pueden ocurrir complicaciones, por ejemplo: Infeccin. Sangrado. Reacciones alrgicas a los medicamentos. Daos a Catering manager u otros rganos. Qu ocurre antes del procedimiento? Medicamentos Consulte al mdico  sobre: Quarry manager o suspender los medicamentos que [place] habitualmente. Esto es muy importante si toma medicamentos para la diabetes o anticoagulantes. Tomar medicamentos como aspirina e ibuprofeno. Estos medicamentos pueden tener un efecto anticoagulante en la [place]. No tome estos medicamentos a menos que el mdico se lo indique. Usar medicamentos de venta libre, vitaminas, hierbas y suplementos. Estudios Pueden hacerle un examen o estudios, tales como: Radiografas de la vejiga, la uretra o los riones. Exploracin por tomografa computarizada (TC) del abdomen o la pelvis. Anlisis de orina para verificar si hay signos de infeccin. Instrucciones generales Siga las instrucciones del mdico respecto de las restricciones de comidas o bebidas. Pregntele al mdico qu medidas se tomarn para ayudar a prevenir una infeccin. Estas medidas pueden incluir las siguientes: Lavar la piel con un jabn antisptico. Recibir antibiticos. Haga que un adulto responsable lo lleve a su casa desde el hospital o la clnica. Qu ocurre durante el procedimiento?  Le administrarn uno o ms de los siguientes medicamentos: Un medicamento para ayudar a Nurse, children's (sedante). Un medicamento para adormecer la zona (anestesia local). Se limpiar la zona que se encuentra alrededor de la abertura de Geologist, engineering. El cistoscopio se introducir por la uretra e ingresar a la vejiga. Un lquido estril fluir por el cistoscopio y Recruitment consultant vejiga. El lquido Building services engineer vejiga para que el mdico pueda examinar claramente las paredes de la vejiga. El mdico examinar la uretra y la vejiga. El mdico puede tomar una biopsia o extraer clculos. El cistoscopio se retirar y Hydrographic surveyor. El procedimiento puede variar segn el mdico y el hospital. Qu puedo esperar despus del procedimiento? Despus del procedimiento, es normal tener los siguientes sntomas: Algo de dolor o molestias en el abdomen y Retail buyer. Sntomas urinarios. Estos  incluyen los siguientes: Dolor o ardor leves al Garment/textile technologist. El dolor debe ceder unos minutos despus de Garment/textile technologist. Esto puede durar 1 semana. Una pequea cantidad de sangre en la [employer]. Sentir que Scientist, clinical (histocompatibility and immunogenetics) produce solo una pequea cantidad [place]. Siga estas instrucciones en su casa: Medicamentos Use los medicamentos de venta libre y los recetados solamente como se lo haya indicado el mdico. Si le recetaron un antibitico, tmelo como se lo haya indicado el mdico. No deje de tomar el antibitico aunque comience a sentirse mejor. Instrucciones generales [place] sus actividades normales segn lo indicado por el mdico. Pregntele al mdico qu actividades son seguras para usted. Si le administraron un sedante durante el procedimiento, puede afectarlo por varias horas. No conduzca ni opere maquinaria hasta que el mdico le indique que es seguro [place]. Observe si hay sangre en la orina. Si la cantidad de [employer] orina aumenta, comunquese con el [place] instrucciones del mdico respecto de las restricciones de comidas o bebidas. Si se tom [place] de tejido para anlisis (biopsia) durante el procedimiento, depende de usted [employer] de la prueba. Consulte al mdico o pregunte en el departamento donde se realiza la prueba cundo estarn [employer]. Beber suficiente lquido como para Theatre manager la orina de color amarillo plido. Cumpla con todas las visitas de seguimiento. Esto es importante. Comunquese con un mdico si: Tiene un dolor que empeora o que no mejora con los medicamentos, especialmente al Garment/textile technologist. Tiene problemas para orinar. Observa ms sangre en la orina. Solicite ayuda de inmediato si: Hay cogulos de [employer]. Siente dolor abdominal. Tiene fiebre o escalofros. No puede orinar. Resumen La cistoscopia es un procedimiento que se [place] para ayudar a Retail buyer y, a  Clinical cytogeneticist, tratar afecciones que afectan las vas urinarias inferiores. La cistoscopia se hace con un instrumento fino en forma de tubo con una luz y una cmara en el extremo. Despus del procedimiento, es comn tener algo de sensibilidad o dolor en el abdomen y Geologist, engineering. Observe si hay sangre en la orina. Si la cantidad de [employer] orina aumenta, comunquese con el mdico. Si le recetaron un antibitico, tmelo como se lo haya indicado el mdico. No deje de tomar el antibitico aunque comience a sentirse mejor. Esta informacin no tiene Marine scientist el consejo del mdico. Asegrese de hacerle al mdico cualquier pregunta que tenga. Document Revised: 04/11/2020 Document Reviewed: 04/11/2020 Elsevier Patient Education  2022 [employer].

## 2021-08-03 LAB — URINALYSIS, COMPLETE
Bilirubin, UA: NEGATIVE
Glucose, UA: NEGATIVE
Ketones, UA: NEGATIVE
Nitrite, UA: NEGATIVE
Protein,UA: NEGATIVE
Specific Gravity, UA: 1.015 (ref 1.005–1.030)
Urobilinogen, Ur: 0.2 mg/dL (ref 0.2–1.0)
pH, UA: 5.5 (ref 5.0–7.5)

## 2021-08-03 LAB — MICROSCOPIC EXAMINATION: Bacteria, UA: NONE SEEN

## 2021-08-08 LAB — MYCOPLASMA / UREAPLASMA CULTURE
Mycoplasma hominis Culture: NEGATIVE
Ureaplasma urealyticum: NEGATIVE

## 2021-08-12 ENCOUNTER — Other Ambulatory Visit: Payer: Self-pay | Admitting: Gastroenterology

## 2021-08-21 NOTE — Progress Notes (Incomplete)
° °  08/21/21  CC: No chief complaint on file.    HPI: Carrie Bryant is a 71 y.o.female with a personal history of pelvic pain/dysuria, kidney stones, and chronic constipation, who presents today for cystoscopy and pelvic exam.   She underwent a CT abdomen and pelvis on 02/14/2021 for iron deficiency and changes in bowel habits. This revealed 1.8 cm low-attenuation lesion in lower pole of left kidney remains stable, and could represent a proteinaceous cyst or solid renal mass. Abdomen MRI without and with contrast recommended for further characterization.   Repeat CT abdomen and pelvis without contrast revealed Cortical scarring LEFT kidney. Multiple BILATERAL renal cysts. Tiny BILATERAL nonobstructing renal calculi, largest mid RIGHT kidney 4 mm diameter. No hydronephrosis, ureteral calcification or ureteral dilatation. Tiny parenchymal calcification at cortex of LEFT kidney posteriorly. Bladder unremarkable.  Was negative for ureaplasma and mycoplasma.   There were no vitals filed for this visit. NED. A&Ox3.   No respiratory distress   Abd soft, NT, ND Normal external genitalia with patent urethral meatus  Cystoscopy Procedure Note  Patient identification was confirmed, informed consent was obtained, and patient was prepped using Betadine solution.  Lidocaine jelly was administered per urethral meatus.    Procedure: - Flexible cystoscope introduced, without any difficulty.   - Thorough search of the bladder revealed:    normal urethral meatus    normal urothelium    no stones    no ulcers     no tumors    no urethral polyps    no trabeculation  - Ureteral orifices were normal in position and appearance.  Post-Procedure: - Patient tolerated the procedure well  Assessment/ Plan:    No follow-ups on file.  I,Kailey Littlejohn,acting as a Education administrator for Hollice Espy, MD.,have documented all relevant documentation on the behalf of Hollice Espy, MD,as directed by  Hollice Espy, MD while in the presence of Hollice Espy, MD.

## 2021-08-22 ENCOUNTER — Other Ambulatory Visit: Payer: Medicare Other | Admitting: Urology

## 2021-10-13 ENCOUNTER — Other Ambulatory Visit: Payer: Medicare Other | Admitting: Urology

## 2021-10-17 ENCOUNTER — Ambulatory Visit (INDEPENDENT_AMBULATORY_CARE_PROVIDER_SITE_OTHER): Payer: Medicare Other | Admitting: Urology

## 2021-10-17 VITALS — BP 124/81 | HR 59 | Ht 59.0 in | Wt 150.0 lb

## 2021-10-17 DIAGNOSIS — N2889 Other specified disorders of kidney and ureter: Secondary | ICD-10-CM

## 2021-10-17 DIAGNOSIS — N281 Cyst of kidney, acquired: Secondary | ICD-10-CM | POA: Diagnosis not present

## 2021-10-17 DIAGNOSIS — R300 Dysuria: Secondary | ICD-10-CM | POA: Diagnosis not present

## 2021-10-17 DIAGNOSIS — R102 Pelvic and perineal pain: Secondary | ICD-10-CM | POA: Diagnosis not present

## 2021-10-17 LAB — URINALYSIS, COMPLETE
Bilirubin, UA: NEGATIVE
Glucose, UA: NEGATIVE
Ketones, UA: NEGATIVE
Nitrite, UA: NEGATIVE
Protein,UA: NEGATIVE
Specific Gravity, UA: 1.015 (ref 1.005–1.030)
Urobilinogen, Ur: 0.2 mg/dL (ref 0.2–1.0)
pH, UA: 6 (ref 5.0–7.5)

## 2021-10-17 LAB — MICROSCOPIC EXAMINATION: Bacteria, UA: NONE SEEN

## 2021-10-17 MED ORDER — ESTRADIOL 0.1 MG/GM VA CREA: 1.0000 | TOPICAL_CREAM | Freq: Every day | VAGINAL | 2 refills | Status: AC

## 2021-10-17 NOTE — Progress Notes (Signed)
?  10/17/2021 ?CC:  ?Chief Complaint  ?Patient presents with  ? Cysto  ? ? ? ?HPI: ?Carrie Bryant is a 71 y.o.female with a personal history of pelvic pain, dysuria, kidney stones, and chronic constipation, who presents today for a diagnostic cystoscopy and pelvic exam.  ? ?CT abdomen and pelvic on 02/14/2021 that visualized a 1.8 cm low-attenuation lesion in lower pole of left kidney remains stable, and could represent a proteinaceous cyst or solid renal mass. ? ?A repeat CT abdomen and pelvis wo contrast revealed cortical left kidney scarring and multiple bilateral renal cysts. It also visualized tiny bilateral nonobstructive renal calculi the largest in mid right kidney measuring up to 4 mm diameter.  ? ?She is accompanied by a [place] interpreter.  ? ?Vitals:  ? 10/17/21 1554  ?BP: 124/81  ?Pulse: (!) 59  ? ?NED. A&Ox3.   ?No respiratory distress   ?Abd soft, NT, ND ?Normal external genitalia with patent urethral meatus, some vaginal atrophy and periurethral atrophy also noted ? ?Cystoscopy Procedure Note ? ?Patient identification was confirmed, informed consent was obtained, and patient was prepped using Betadine solution.  Lidocaine jelly was administered per urethral meatus.   ? ?Procedure: ?- Flexible cystoscope introduced, without any difficulty.   ?- Thorough search of the bladder revealed: ?   normal urethral meatus ?   normal urothelium ?   no stones ?   no ulcers  ?   no tumors ?   no urethral polyps ?   no trabeculation ? ?- Ureteral orifices were normal in position and appearance. ? ?Post-Procedure: ?- Patient tolerated the procedure well ? ?Assessment/ Plan: ? ?Pelvic pain/ Dysuria   ?- Cystoscopy was reassuring, no obvious bladder pathology ?- Recommend addition of vaginal estrogen cream to regimen x3 weekly. She is agreeable with this plan  ?- Vaginal estrogen cream; prescribed  ?- Urine sent for cytology ? ?F/u 3 months recheck symptoms ? ?Conley Rolls as a scribe for Hollice Espy,  MD.,have documented all relevant documentation on the behalf of Hollice Espy, MD,as directed by  Hollice Espy, MD while in the presence of Hollice Espy, MD. ? ?I have reviewed the above documentation for accuracy and completeness, and I agree with the above.  ? ?Hollice Espy, MD ? ?

## 2021-10-17 NOTE — Patient Instructions (Signed)
Estrogen Cream Instruction Discard applicator Apply pea sized amount to tip of finger to urethra before bed. Wash hands well after application. Use Monday, Wednesday and Friday ?

## 2021-10-19 LAB — CYTOLOGY - NON PAP

## 2021-11-24 ENCOUNTER — Other Ambulatory Visit: Payer: Self-pay | Admitting: Family Medicine

## 2021-11-24 DIAGNOSIS — M810 Age-related osteoporosis without current pathological fracture: Secondary | ICD-10-CM

## 2021-12-06 ENCOUNTER — Other Ambulatory Visit: Payer: Self-pay | Admitting: Orthopedic Surgery

## 2021-12-06 DIAGNOSIS — S32010A Wedge compression fracture of first lumbar vertebra, initial encounter for closed fracture: Secondary | ICD-10-CM

## 2021-12-06 DIAGNOSIS — S22080A Wedge compression fracture of T11-T12 vertebra, initial encounter for closed fracture: Secondary | ICD-10-CM

## 2021-12-19 ENCOUNTER — Ambulatory Visit
Admission: RE | Admit: 2021-12-19 | Discharge: 2021-12-19 | Disposition: A | Discharge status: Home / Self Care | Payer: Medicare Other | Source: Ambulatory Visit | Attending: Orthopedic Surgery | Admitting: Orthopedic Surgery

## 2021-12-19 DIAGNOSIS — S22080A Wedge compression fracture of T11-T12 vertebra, initial encounter for closed fracture: Secondary | ICD-10-CM | POA: Diagnosis present

## 2021-12-19 DIAGNOSIS — S32010A Wedge compression fracture of first lumbar vertebra, initial encounter for closed fracture: Secondary | ICD-10-CM | POA: Insufficient documentation

## 2021-12-26 ENCOUNTER — Ambulatory Visit
Admission: RE | Admit: 2021-12-26 | Discharge: 2021-12-26 | Disposition: A | Discharge status: Home / Self Care | Payer: Medicare Other | Source: Ambulatory Visit | Attending: Family Medicine | Admitting: Family Medicine

## 2021-12-26 DIAGNOSIS — M810 Age-related osteoporosis without current pathological fracture: Secondary | ICD-10-CM | POA: Diagnosis present

## 2022-01-15 NOTE — Progress Notes (Unsigned)
01/16/2022 4:10 PM   [place] [birth year] [id number]  Referring provider: Theotis Burrow, MD [address] [place],  [place] [address]  Urological history: 1.  Nephrolithiasis -remote history - URS/MET -non-contrast CT, 07/2021 - bilateral nephrolithiasis  2. Pelvic pain/dysuria -likely secondary to vaginal atrophy -cysto, 09/2021 - NED -urine cytology, 09/2021 - negative  3. Renal cysts -non-contrast CT, 07/2021 - Multiple BILATERAL renal cysts  Chief Complaint  Patient presents with   Follow-up   HPI: Carrie Bryant is a 71 y.o. female who presents today for a three month follow up after a trial of vaginal estrogen cream for pelvic pain/dysuria.   They have declined interpreter services.  History is obtained through her daughter, Demetrius Revel.    She has been applying the cream 2 nights weekly.  She states that the pelvic pain and dysuria have improved.  Patient denies any modifying or aggravating factors.  Patient denies any gross hematuria, dysuria or suprapubic/flank pain.  Patient denies any fevers, chills, nausea or vomiting.      PMH: Past Medical History:  Diagnosis Date   Anemia    Anxiety    Arthritis    Asthma    Depression    History of kidney stones    Hypercholesterolemia    Hypertension    Kidney stones    Thyroid disease     Surgical History: Past Surgical History:  Procedure Laterality Date   ABDOMINAL HYSTERECTOMY     BIOPSY  07/18/2020   Procedure: BIOPSY;  Surgeon: Irving Copas., MD;  Location: [facility];  Service: Gastroenterology;;   BIOPSY  02/27/2021   Procedure: BIOPSY;  Surgeon: Irving Copas., MD;  Location: [facility];  Service: Gastroenterology;;   COLONOSCOPY WITH PROPOFOL N/A 05/02/2020   Procedure: COLONOSCOPY WITH BIOPSY;  Surgeon: Virgel Manifold, MD;  Location: [facility];  Service: Endoscopy;  Laterality: N/A;  PRIORITY 4   COLONOSCOPY WITH PROPOFOL N/A 07/18/2020    Procedure: COLONOSCOPY WITH PROPOFOL;  Surgeon: Rush Landmark Telford Nab., MD;  Location: [facility];  Service: Gastroenterology;  Laterality: N/A;   COLONOSCOPY WITH PROPOFOL N/A 02/27/2021   Procedure: COLONOSCOPY WITH PROPOFOL;  Surgeon: Rush Landmark Telford Nab., MD;  Location: [facility];  Service: Gastroenterology;  Laterality: N/A;   ENDOSCOPIC MUCOSAL RESECTION N/A 07/18/2020   Procedure: ENDOSCOPIC MUCOSAL RESECTION;  Surgeon: Rush Landmark Telford Nab., MD;  Location: [facility];  Service: Gastroenterology;  Laterality: N/A;   ESOPHAGOGASTRODUODENOSCOPY (EGD) WITH PROPOFOL N/A 07/18/2020   Procedure: ESOPHAGOGASTRODUODENOSCOPY (EGD) WITH PROPOFOL;  Surgeon: Rush Landmark Telford Nab., MD;  Location: [facility];  Service: Gastroenterology;  Laterality: N/A;   ESOPHAGOGASTRODUODENOSCOPY (EGD) WITH PROPOFOL N/A 02/27/2021   Procedure: ESOPHAGOGASTRODUODENOSCOPY (EGD) WITH PROPOFOL;  Surgeon: Rush Landmark Telford Nab., MD;  Location: [facility];  Service: Gastroenterology;  Laterality: N/A;   HEMOSTASIS CLIP PLACEMENT  07/18/2020   Procedure: HEMOSTASIS CLIP PLACEMENT;  Surgeon: Irving Copas., MD;  [facility];  Service: Gastroenterology;;   POLYPECTOMY N/A 05/02/2020   Procedure: POLYPECTOMY;  Surgeon: Virgel Manifold, MD;  Location: [facility];  Service: Endoscopy;  Laterality: N/A;   POLYPECTOMY  07/18/2020   Procedure: POLYPECTOMY;  Surgeon: Rush Landmark Telford Nab., MD;  Location: [facility];  Service: Gastroenterology;;   POLYPECTOMY  02/27/2021   Procedure: POLYPECTOMY;  Surgeon: Irving Copas., MD;  Location: [facility];  Service: Gastroenterology;;   SUBMUCOSAL LIFTING INJECTION  07/18/2020   Procedure: SUBMUCOSAL LIFTING INJECTION;  Surgeon: Irving Copas., MD;  Location: [facility];  Service: Gastroenterology;;    Home Medications:  Allergies as of 01/16/2022       Reactions   Morphine And Related Anaphylaxis, Other (See  Comments)   dizziness   Codeine Anxiety, Other (See Comments)   Hypertension (elevates BP) & chest pain.        Medication List        Accurate as of January 16, 2022  4:10 PM. If you have any questions, ask your nurse or doctor.          albuterol 108 (90 Base) MCG/ACT inhaler Commonly known as: VENTOLIN HFA Inhale 2 puffs into the lungs every 4 (four) hours as needed for wheezing or shortness of breath.   clonazePAM 2 MG tablet Commonly known as: KLONOPIN Take 2 mg by mouth at bedtime.   cyclobenzaprine 5 MG tablet Commonly known as: FLEXERIL Take 5 mg by mouth 3 (three) times daily as needed.   escitalopram 20 MG tablet Commonly known as: LEXAPRO Take 20 mg by mouth daily.   esomeprazole 40 MG capsule Commonly known as: NEXIUM TAKE 1 CAPSULE (40 MG TOTAL) BY MOUTH DAILY AT 12 NOON.   estradiol 0.1 MG/GM vaginal cream Commonly known as: ESTRACE Place 1 Applicatorful vaginally at bedtime. Estrogen Cream Instruction Discard applicator Apply pea sized amount to tip of finger to urethra before bed. Wash hands well after application. Use Monday, Wednesday and Friday   ferrous sulfate 325 (65 FE) MG tablet Take 325 mg by mouth daily.   fluticasone 50 MCG/ACT nasal spray Commonly known as: FLONASE Place 2 sprays into both nostrils 2 (two) times daily.   furosemide 20 MG tablet Commonly known as: LASIX Take 20 mg by mouth daily.   gabapentin 100 MG capsule Commonly known as: NEURONTIN Take 100 mg by mouth at bedtime.   ketorolac 10 MG tablet Commonly known as: TORADOL Take 1 tablet (10 mg total) by mouth every 6 (six) hours as needed.   levothyroxine 25 MCG tablet Commonly known as: SYNTHROID Take 25 mcg by mouth daily before breakfast.   montelukast 10 MG tablet Commonly known as: SINGULAIR Take 10 mg by mouth at bedtime.   multivitamin tablet Take 1 tablet by mouth daily.   simvastatin 20 MG tablet Commonly known as: ZOCOR Take 20 mg by mouth every  evening.   simvastatin 40 MG tablet Commonly known as: ZOCOR SMARTSIG:1 Tablet(s) By Mouth Every Evening   tamsulosin 0.4 MG Caps capsule Commonly known as: FLOMAX Take 1 capsule (0.4 mg total) by mouth daily.   traZODone 150 MG tablet Commonly known as: DESYREL Take 150 mg by mouth at bedtime.   valsartan 80 MG tablet Commonly known as: DIOVAN Take 80 mg by mouth daily.   vitamin B-12 1000 MCG tablet Commonly known as: CYANOCOBALAMIN Take 1,000 mcg by mouth daily.   Vitamin D-3 25 MCG (1000 UT) Caps Take 1,000 Units by mouth daily.        Allergies:  Allergies  Allergen Reactions   Morphine And Related Anaphylaxis and Other (See Comments)    dizziness   Codeine Anxiety and Other (See Comments)    Hypertension (elevates BP) & chest pain.    Family History: Family History  Problem Relation Age of Onset   Colon cancer Neg Hx    Esophageal cancer Neg Hx    Inflammatory bowel disease Neg Hx    Liver disease Neg Hx    Pancreatic cancer Neg Hx    Rectal cancer Neg Hx    Stomach  cancer Neg Hx     Social History:  reports that she has never smoked. She has never used smokeless tobacco. She reports that she does not drink alcohol and does not use drugs.  ROS: Pertinent ROS in HPI  Physical Exam: BP 130/79   Pulse 60   Ht '4\' 11"'  (1.499 m)   Wt 150 lb (68 kg)   BMI 30.30 kg/m   Constitutional:  Well nourished. Alert and oriented, No acute distress. HEENT: [place] AT, moist mucus membranes.  Trachea midline Cardiovascular: No clubbing, cyanosis, or edema. Respiratory: Normal respiratory effort, no increased work of breathing. Neurologic: Grossly intact, no focal deficits, moving all 4 extremities. Psychiatric: Normal mood and affect.    Laboratory Data: N/A  Pertinent Imaging: N/A  Assessment & Plan:    1. Vaginal atrophy -Continue vaginal estrogen cream 2 times weekly  Return in about 1 year (around 01/17/2023) for recheck .  These notes generated with  voice recognition software. I apologize for typographical errors.  Zara Council, PA-C  [facility] [address]  [place], [place] [address] [phone]

## 2022-01-16 ENCOUNTER — Ambulatory Visit (INDEPENDENT_AMBULATORY_CARE_PROVIDER_SITE_OTHER): Payer: Medicare Other | Admitting: Urology

## 2022-01-16 ENCOUNTER — Encounter: Payer: Self-pay | Admitting: Urology

## 2022-01-16 VITALS — BP 130/79 | HR 60 | Ht 59.0 in | Wt 150.0 lb

## 2022-01-16 DIAGNOSIS — N952 Postmenopausal atrophic vaginitis: Secondary | ICD-10-CM | POA: Diagnosis not present

## 2022-02-07 ENCOUNTER — Other Ambulatory Visit: Payer: Self-pay | Admitting: Gastroenterology

## 2022-05-08 ENCOUNTER — Ambulatory Visit
Admission: EM | Admit: 2022-05-08 | Discharge: 2022-05-08 | Disposition: A | Discharge status: Home / Self Care | Payer: Medicare Other | Attending: Family Medicine | Admitting: Family Medicine

## 2022-05-08 ENCOUNTER — Ambulatory Visit (INDEPENDENT_AMBULATORY_CARE_PROVIDER_SITE_OTHER): Payer: Medicare Other

## 2022-05-08 DIAGNOSIS — M545 Low back pain, unspecified: Secondary | ICD-10-CM | POA: Diagnosis not present

## 2022-05-08 DIAGNOSIS — S39012A Strain of muscle, fascia and tendon of lower back, initial encounter: Secondary | ICD-10-CM

## 2022-05-08 DIAGNOSIS — R051 Acute cough: Secondary | ICD-10-CM

## 2022-05-08 DIAGNOSIS — R059 Cough, unspecified: Secondary | ICD-10-CM

## 2022-05-08 DIAGNOSIS — M546 Pain in thoracic spine: Secondary | ICD-10-CM | POA: Diagnosis not present

## 2022-05-08 MED ORDER — IBUPROFEN 400 MG PO TABS: 400.0000 mg | ORAL_TABLET | Freq: Four times a day (QID) | ORAL | 0 refills | Status: DC | PRN

## 2022-05-08 MED ORDER — TIZANIDINE HCL 4 MG PO TABS: 4.0000 mg | ORAL_TABLET | Freq: Four times a day (QID) | ORAL | 0 refills | Status: DC | PRN

## 2022-05-08 NOTE — ED Triage Notes (Signed)
PT declines an interpreter  Pt c/o pinching pain in her lower left back while walking and moving x2days  Pt denies any new movements or exercise.   Pt had a nerve burning procedure done on her mid back due to broken vertebras by Sharlet Salina on 03/15/22.

## 2022-05-08 NOTE — ED Provider Notes (Signed)
[facility]    CSN: [id number] Arrival date & time: 05/08/22  1221      History   Chief Complaint Chief Complaint  Patient presents with   Back Pain    HPI  HPI [facility] Carrie Bryant is a 71 y.o. female.   Patient's daughter serves as her Patent attorney.  Carrie Bryant brought in by daughter for pinching back pain for the past 2 days. Pt has been walking around intermittently screaming out in pain.  She was walking around the mall at the stores without any difficulty.  This was about 3 days ago.  She was seen by Dr Sharlet Salina in September and they burned the nerves in her back.  Patient's daughter has not contacted patient's [facility] physician.  She took Flexeril yesterday but states that just made her sleep.  She is unsure if it helped or not.  She has been put on lidocaine patches that did not help.  Standing tends to make the pain worse.  She has 2 known broken vertebrae in her back.  Has no new bowel or bladder loss.  She does feel when she wipes.  No nausea, vomiting, dysuria, unintended weight loss.  Had kidney stones in the past but this does not feel the same. She has been coughing a lot in the last 2 days.  No fever.      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/2021   Anemia 06/14/2020   Special screening for malignant neoplasms, colon    Polyp of transverse colon    Polyp of sigmoid colon    Adenomatous polyp of ascending colon    Age-related osteoporosis without current pathological fracture 04/11/2020   Chronic midline low back pain without sciatica 04/11/2020   Primary osteoarthritis involving multiple joints 04/11/2020   Rheumatoid arthritis of multiple sites with negative  rheumatoid factor ([place]) 04/11/2020    Past Surgical History:  Procedure Laterality Date   ABDOMINAL HYSTERECTOMY     BIOPSY  07/18/2020   Procedure: BIOPSY;  Surgeon: Irving Copas., MD;  Location: [place];  Service: Gastroenterology;;   BIOPSY  02/27/2021   Procedure: BIOPSY;  Surgeon: Irving Copas., MD;  Location: [facility];  Service: Gastroenterology;;   COLONOSCOPY WITH PROPOFOL N/A 05/02/2020   Procedure: COLONOSCOPY WITH BIOPSY;  Surgeon: Virgel Manifold, MD;  Location: [place];  Service: Endoscopy;  Laterality: N/A;  PRIORITY 4   COLONOSCOPY WITH PROPOFOL N/A 07/18/2020   Procedure: COLONOSCOPY WITH PROPOFOL;  Surgeon: Rush Landmark Telford Nab., MD;  Location: [place];  Service: Gastroenterology;  Laterality: N/A;   COLONOSCOPY WITH PROPOFOL N/A 02/27/2021   Procedure: COLONOSCOPY WITH PROPOFOL;  Surgeon: Rush Landmark Telford Nab., MD;  Location: [facility];  Service: Gastroenterology;  Laterality: N/A;   ENDOSCOPIC MUCOSAL RESECTION N/A 07/18/2020   Procedure: ENDOSCOPIC MUCOSAL RESECTION;  Surgeon: Rush Landmark Telford Nab., MD;  Location: [place];  Service: Gastroenterology;  Laterality: N/A;   ESOPHAGOGASTRODUODENOSCOPY (EGD) WITH PROPOFOL N/A 07/18/2020   Procedure: ESOPHAGOGASTRODUODENOSCOPY (EGD) WITH PROPOFOL;  Surgeon: Rush Landmark Telford Nab., MD;  Location: [place];  Service: Gastroenterology;  Laterality: N/A;   ESOPHAGOGASTRODUODENOSCOPY (  EGD) WITH PROPOFOL N/A 02/27/2021   Procedure: ESOPHAGOGASTRODUODENOSCOPY (EGD) WITH PROPOFOL;  Surgeon: Rush Landmark Telford Nab., MD;  Location: [facility];  Service: Gastroenterology;  Laterality: N/A;   HEMOSTASIS CLIP PLACEMENT  07/18/2020   Procedure: HEMOSTASIS CLIP PLACEMENT;  Surgeon: Irving Copas., MD;  Location: [facility];  Service: Gastroenterology;;   POLYPECTOMY N/A 05/02/2020   Procedure: POLYPECTOMY;  Surgeon: Virgel Manifold, MD;  Location: [place];  Service: Endoscopy;  Laterality: N/A;   POLYPECTOMY  07/18/2020   Procedure: POLYPECTOMY;  Surgeon: Rush Landmark Telford Nab., MD;  Location: [place];  Service: Gastroenterology;;   POLYPECTOMY  02/27/2021   Procedure: POLYPECTOMY;  Surgeon: Irving Copas., MD;  Location: [facility];  Service: Gastroenterology;;   SUBMUCOSAL LIFTING INJECTION  07/18/2020   Procedure: SUBMUCOSAL LIFTING INJECTION;  Surgeon: Irving Copas., MD;  Location[facility];  Service: Gastroenterology;;    OB History   No obstetric history on file.      Home Medications    Prior to Admission medications   Medication Sig Start Date End Date Taking? Authorizing Provider  albuterol (VENTOLIN HFA) 108 (90 Base) MCG/ACT inhaler Inhale 2 puffs into the lungs every 4 (four) hours as needed for wheezing or shortness of breath. 01/07/20  Yes Lamptey, Myrene Galas, MD  Cholecalciferol (VITAMIN D-3) 25 MCG (1000 UT) CAPS Take 1,000 Units by mouth daily.   Yes [provider]  clonazePAM (KLONOPIN) 2 MG tablet Take 2 mg by mouth at bedtime.   Yes [provider]  escitalopram (LEXAPRO) 20 MG tablet Take 20 mg by mouth daily. 09/05/21  Yes [provider]  esomeprazole (NEXIUM) 40 MG capsule TAKE 1 CAPSULE (40 MG TOTAL) BY MOUTH DAILY AT 12 NOON. 02/07/22  Yes Mansouraty, Telford Nab., MD  estradiol (ESTRACE) 0.1 MG/GM vaginal cream Place 1 Applicatorful vaginally at bedtime. Estrogen Cream Instruction Discard applicator Apply pea sized amount to tip of finger to urethra before bed. Wash hands well after application. Use Monday, Wednesday and Friday 10/17/21  Yes Hollice Espy, MD  ferrous sulfate 325 (65 FE) MG tablet Take 325 mg by mouth daily.   Yes [provider]  fluticasone (FLONASE) 50 MCG/ACT nasal spray Place 2 sprays into both nostrils 2 (two) times daily. 02/08/21  Yes [provider]  furosemide (LASIX) 20 MG tablet Take 20 mg by mouth daily. 12/15/19   Yes [provider]  gabapentin (NEURONTIN) 100 MG capsule Take 100 mg by mouth at bedtime. 09/11/21  Yes [provider]  ibuprofen (ADVIL) 400 MG tablet Take 1 tablet (400 mg total) by mouth every 6 (six) hours as needed. 05/08/22  Yes Yaresly Menzel, Ronnette Juniper, DO  levothyroxine (SYNTHROID) 25 MCG tablet Take 25 mcg by mouth daily before breakfast. 02/21/20  Yes [provider]  montelukast (SINGULAIR) 10 MG tablet Take 10 mg by mouth at bedtime. 12/15/19  Yes [provider]  Multiple Vitamin (MULTIVITAMIN) tablet Take 1 tablet by mouth daily.   Yes [provider]  simvastatin (ZOCOR) 20 MG tablet Take 20 mg by mouth every evening.   Yes [provider]  simvastatin (ZOCOR) 40 MG tablet SMARTSIG:1 Tablet(s) By Mouth Every Evening 10/16/21  Yes [provider]  tiZANidine (ZANAFLEX) 4 MG tablet Take 1 tablet (4 mg total) by mouth every 6 (six) hours as needed for muscle spasms. 05/08/22  Yes Lennon Boutwell, DO  traZODone (DESYREL) 150 MG tablet Take 150 mg by mouth at bedtime.   Yes [provider]  valsartan (DIOVAN) 80 MG tablet Take 80 mg by mouth daily. 12/27/19  Yes [provider]  vitamin B-12 (CYANOCOBALAMIN) 1000 MCG tablet Take 1,000 mcg by mouth daily.   Yes [provider]  tamsulosin (FLOMAX) 0.4 MG CAPS capsule Take 1 capsule (0.4 mg total) by mouth daily. Patient not taking: Reported on 01/16/2022 01/01/21   Margarette Canada, NP    Family History Family History  Problem Relation Age of Onset   Colon cancer Neg Hx    Esophageal cancer Neg Hx    Inflammatory bowel disease Neg Hx    Liver disease Neg Hx    Pancreatic cancer Neg Hx    Rectal cancer Neg Hx    Stomach cancer Neg Hx     Social History Social History   Tobacco Use   Smoking status: Never   Smokeless tobacco: Never  Vaping Use   Vaping Use: Never used  Substance Use Topics   Alcohol use: Never   Drug use: Never     Allergies    Morphine and related and Codeine   Review of Systems Review of Systems: egative unless otherwise stated in HPI.      Physical Exam Triage Vital Signs ED Triage Vitals  Enc Vitals Group     BP 05/08/22 1236 124/71     Pulse Rate 05/08/22 1236 65     Resp 05/08/22 1236 18     Temp 05/08/22 1236 98.1 F (36.7 C)     Temp Source 05/08/22 1236 Oral     SpO2 05/08/22 1236 96 %     Weight 05/08/22 1234 150 lb (68 kg)     Height 05/08/22 1234 '4\' 11"'$  (1.499 m)     Head Circumference --      Peak Flow --      Pain Score 05/08/22 1233 8     Pain Loc --      Pain Edu? --      Excl. in [place]? --    No data found.  Updated Vital Signs BP 124/71 (BP Location: Left Arm)   Pulse 65   Temp 98.1 F (36.7 C) (Oral)   Resp 18   Ht '4\' 11"'$  (1.499 m)   Wt 68 kg   SpO2 96%   BMI 30.30 kg/m   Visual Acuity Right Eye Distance:   Left Eye Distance:   Bilateral Distance:    Right Eye Near:   Left Eye Near:    Bilateral Near:     Physical Exam GEN: well appearing female in no acute distress  CVS: well perfused, regular rate and rhythm RESP: speaking in full sentences without pause, no respiratory distress, clear to auscultation bilaterally MSK:  Spine: - Inspection: no gross deformity or asymmetry, swelling or ecchymosis. No overlying skin changes  - Palpation: +TTP over the spinous processes lumbar and thoracic, posterior left ribs tenderness, bilateral lumbar paraspinal muscles, no SI joint tenderness bilaterally - Strength 5/5 bilateral LE, gross sensation intact  - ROM: limited due to pain - Special testing: Negative straight leg raise SKIN: warm, dry, no overly skin rash or erythema    UC Treatments / Results  Labs (all labs ordered are listed, but only abnormal results are displayed) Labs Reviewed - No data to display  EKG   Radiology DG Thoracic Spine 2 View  Result Date: 05/08/2022 CLINICAL DATA:  Cough, back pain EXAM: THORACIC SPINE 2 VIEWS COMPARISON:  None  Available. FINDINGS: There is mild decrease in height of upper endplate  of body of T12 vertebra, most likely old compression. No recent fracture is seen. Alignment of posterior margins of vertebral bodies is within normal limits. Paraspinal soft tissues are unremarkable. Small anterior bony spurs are noted in mid and lower thoracic spine. IMPRESSION: Slight decrease in height of upper endplate of body of I33 vertebra most likely is old mild compression. No recent fracture is seen. Degenerative changes are noted with small bony spurs in mid and lower thoracic spine. Electronically Signed   By: Elmer Picker M.D.   On: 05/08/2022 14:43   DG Lumbar Spine Complete  Result Date: 05/08/2022 CLINICAL DATA:  Back pain EXAM: LUMBAR SPINE - COMPLETE 4+ VIEW COMPARISON:  MR lumbar spine done on 12/19/2021 FINDINGS: No recent fracture is seen in lumbar spine. There is minimal decrease in height of upper endplate of body of A25 vertebra which has not changed. Alignment of posterior margins of vertebral bodies is within normal limits. Degenerative changes are noted with small anterior bony spurs. There is facet hypertrophy at the L5-S1 level. There is no significant disc space narrowing. Arterial calcifications are seen in soft tissues. IMPRESSION: No recent fracture is seen in lumbar spine. Degenerative changes are noted. Electronically Signed   By: Elmer Picker M.D.   On: 05/08/2022 14:41   DG Ribs Unilateral W/Chest Left  Result Date: 05/08/2022 CLINICAL DATA:  Cough, left chest pain EXAM: LEFT RIBS AND CHEST - 3+ VIEW COMPARISON:  01/07/2020 FINDINGS: Cardiac size is within normal limits. There are no signs of pulmonary edema or focal pulmonary consolidation. Right hemidiaphragm is elevated. Small linear density in left lower lung fields may suggest scarring. There is no pleural effusion or pneumothorax. No displaced fractures are seen in left ribs. IMPRESSION: No fracture is seen in left ribs. No active  cardiopulmonary disease is seen. Electronically Signed   By: Elmer Picker M.D.   On: 05/08/2022 14:38    Procedures Procedures (including critical care time)  Medications Ordered in UC Medications - No data to display  Initial Impression / Assessment and Plan / UC Course  I have reviewed the triage vital signs and the nursing notes.  Pertinent labs & imaging results that were available during my care of the patient were reviewed by me and considered in my medical decision making (see chart for details).      Pt is a 71 y.o.  female with history of rheumatoid arthritis, T12 compression fracture who presents for 2 days of left back pain after increased exercise.  Obtained  plain films.    On chart review she had bilateral L3 and L4 medial branches and L5 dorsal rami radiofrequency neurotomy 03/15/2022 clear fluoroscopic guidance.  She had a similar procedure about a month earlier by Dr. Sharlet Salina, PM&R.  She had an MRI in June 2023 that showed chronic compression fracture of T12 and facet arthropathy.   Thoracic spine showed no new T12 worsening compression.  There are some facet hypertrophy at L5-S1 with bone spurs and degenerative changes in the lumbar spine.  No rib fractures seen.  Or masses.  Patient to gradually return to normal activities, as tolerated and continue ordinary activities within the limits permitted by pain. Prescribed Naproxen sodium   and muscle relaxer   for pain relief.  Advised patient to avoid other NSAIDs while taking  Naprosyn. Tylenol and Lidocaine patches PRN for multimodal pain relief. Counseled patient on red flag symptoms and when to seek immediate care.   No red flags suggesting cauda equina  syndrome or progressive major motor weakness. Patient to follow up with orthopedic provider if symptoms do not improve with conservative treatment.  Return and ED precautions given.    Discussed MDM, treatment plan and plan for follow-up with patient/parent who agrees  with plan.   Final Clinical Impressions(s) / UC Diagnoses   Final diagnoses:  Strain of lumbar region, initial encounter  Acute cough     Discharge Instructions      If medication was prescribed, stop by the pharmacy to pick up your prescriptions.  For your  pain, Take 1000 mg Tylenol three times a day, take muscle relaxer (Zanaflex) three a day, take Ibuprofen three times a day,  as needed for pain.    Apply warm compresses intermittently, as needed.  As pain recedes, begin normal activities slowly as tolerated.  Follow up with primary care provider or an orthopedic provider, if symptoms persist.  Watch for worsening symptoms such as an increasing weakness or loss of sensation, increasing pain and/or the loss of bladder or bowel function. Should any of these occur, go to the emergency department immediately.        ED Prescriptions     Medication Sig Dispense Auth. Provider   tiZANidine (ZANAFLEX) 4 MG tablet Take 1 tablet (4 mg total) by mouth every 6 (six) hours as needed for muscle spasms. 30 tablet Raygen Dahm, DO   ibuprofen (ADVIL) 400 MG tablet Take 1 tablet (400 mg total) by mouth every 6 (six) hours as needed. 30 tablet Lyndee Hensen, DO      PDMP not reviewed this encounter.   Lyndee Hensen, DO 05/08/22 2101

## 2022-05-08 NOTE — Discharge Instructions (Signed)
If medication was prescribed, stop by the pharmacy to pick up your prescriptions.  For your  pain, Take 1000 mg Tylenol three times a day, take muscle relaxer (Zanaflex) three a day, take Ibuprofen three times a day,  as needed for pain.    Apply warm compresses intermittently, as needed.  As pain recedes, begin normal activities slowly as tolerated.  Follow up with primary care provider or an orthopedic provider, if symptoms persist.  Watch for worsening symptoms such as an increasing weakness or loss of sensation, increasing pain and/or the loss of bladder or bowel function. Should any of these occur, go to the emergency department immediately.

## 2022-09-12 ENCOUNTER — Ambulatory Visit
Admission: EM | Admit: 2022-09-12 | Discharge: 2022-09-12 | Disposition: A | Discharge status: Home / Self Care | Payer: 59 | Attending: Family Medicine | Admitting: Family Medicine

## 2022-09-12 ENCOUNTER — Other Ambulatory Visit: Payer: Self-pay

## 2022-09-12 ENCOUNTER — Ambulatory Visit (INDEPENDENT_AMBULATORY_CARE_PROVIDER_SITE_OTHER): Payer: 59

## 2022-09-12 DIAGNOSIS — Z1152 Encounter for screening for COVID-19: Secondary | ICD-10-CM | POA: Diagnosis not present

## 2022-09-12 DIAGNOSIS — I700 Atherosclerosis of aorta: Secondary | ICD-10-CM | POA: Diagnosis not present

## 2022-09-12 DIAGNOSIS — N281 Cyst of kidney, acquired: Secondary | ICD-10-CM | POA: Insufficient documentation

## 2022-09-12 DIAGNOSIS — R112 Nausea with vomiting, unspecified: Secondary | ICD-10-CM | POA: Diagnosis present

## 2022-09-12 DIAGNOSIS — E876 Hypokalemia: Secondary | ICD-10-CM | POA: Diagnosis not present

## 2022-09-12 DIAGNOSIS — Z8379 Family history of other diseases of the digestive system: Secondary | ICD-10-CM | POA: Insufficient documentation

## 2022-09-12 DIAGNOSIS — Z122 Encounter for screening for malignant neoplasm of respiratory organs: Secondary | ICD-10-CM | POA: Insufficient documentation

## 2022-09-12 DIAGNOSIS — R911 Solitary pulmonary nodule: Secondary | ICD-10-CM | POA: Diagnosis not present

## 2022-09-12 DIAGNOSIS — R1084 Generalized abdominal pain: Secondary | ICD-10-CM

## 2022-09-12 DIAGNOSIS — R197 Diarrhea, unspecified: Secondary | ICD-10-CM

## 2022-09-12 LAB — URINALYSIS, ROUTINE W REFLEX MICROSCOPIC
Glucose, UA: NEGATIVE mg/dL
Ketones, ur: NEGATIVE mg/dL
Leukocytes,Ua: NEGATIVE
Nitrite: NEGATIVE
Protein, ur: 30 mg/dL — AB
Specific Gravity, Urine: 1.015 (ref 1.005–1.030)
pH: 6 (ref 5.0–8.0)

## 2022-09-12 LAB — COMPREHENSIVE METABOLIC PANEL
ALT: 20 U/L (ref 0–44)
AST: 29 U/L (ref 15–41)
Albumin: 4 g/dL (ref 3.5–5.0)
Alkaline Phosphatase: 66 U/L (ref 38–126)
Anion gap: 8 (ref 5–15)
BUN: 9 mg/dL (ref 8–23)
CO2: 22 mmol/L (ref 22–32)
Calcium: 7.7 mg/dL — ABNORMAL LOW (ref 8.9–10.3)
Chloride: 103 mmol/L (ref 98–111)
Creatinine, Ser: 0.75 mg/dL (ref 0.44–1.00)
GFR, Estimated: >60 mL/min (ref >60)
Glucose, Bld: 101 mg/dL — ABNORMAL HIGH (ref 70–99)
Potassium: 3 mmol/L — ABNORMAL LOW (ref 3.5–5.1)
Sodium: 133 mmol/L — ABNORMAL LOW (ref 135–145)
Total Bilirubin: 0.4 mg/dL (ref 0.3–1.2)
Total Protein: 7.5 g/dL (ref 6.5–8.1)

## 2022-09-12 LAB — CBC WITH DIFFERENTIAL/PLATELET
Abs Immature Granulocytes: 0.01 10*3/uL (ref 0.00–0.07)
Basophils Absolute: 0 10*3/uL (ref 0.0–0.1)
Basophils Relative: 0 %
Eosinophils Absolute: 0 10*3/uL (ref 0.0–0.5)
Eosinophils Relative: 1 %
HCT: 43 % (ref 36.0–46.0)
Hemoglobin: 14.9 g/dL (ref 12.0–15.0)
Immature Granulocytes: 0 %
Lymphocytes Relative: 23 %
Lymphs Abs: 0.9 10*3/uL (ref 0.7–4.0)
MCH: 29.9 pg (ref 26.0–34.0)
MCHC: 34.7 g/dL (ref 30.0–36.0)
MCV: 86.2 fL (ref 80.0–100.0)
Monocytes Absolute: 0.4 10*3/uL (ref 0.1–1.0)
Monocytes Relative: 11 %
Neutro Abs: 2.6 10*3/uL (ref 1.7–7.7)
Neutrophils Relative %: 65 %
Platelets: 321 10*3/uL (ref 150–400)
RBC: 4.99 MIL/uL (ref 3.87–5.11)
RDW: 13.4 % (ref 11.5–15.5)
WBC: 4 10*3/uL (ref 4.0–10.5)
nRBC: 0 % (ref 0.0–0.2)

## 2022-09-12 LAB — RESP PANEL BY RT-PCR (RSV, FLU A&B, COVID)  RVPGX2
Influenza A by PCR: NEGATIVE
Influenza B by PCR: NEGATIVE
Resp Syncytial Virus by PCR: NEGATIVE
SARS Coronavirus 2 by RT PCR: NEGATIVE

## 2022-09-12 LAB — URINALYSIS, MICROSCOPIC (REFLEX)

## 2022-09-12 LAB — LIPASE, BLOOD: Lipase: 33 U/L (ref 11–51)

## 2022-09-12 MED ORDER — IOHEXOL 300 MG/ML  SOLN
100.0000 mL | Freq: Once | INTRAMUSCULAR | Status: AC | PRN
  Administered 2022-09-12: 100 mL via INTRAVENOUS

## 2022-09-12 MED ORDER — ONDANSETRON HCL 4 MG/2ML IJ SOLN
4.0000 mg | Freq: Once | INTRAMUSCULAR | Status: AC
  Administered 2022-09-12: 4 mg via INTRAMUSCULAR

## 2022-09-12 MED ORDER — ONDANSETRON 4 MG PO TBDP: 4.0000 mg | ORAL_TABLET | Freq: Three times a day (TID) | ORAL | 0 refills | Status: DC | PRN

## 2022-09-12 MED ORDER — POTASSIUM CHLORIDE CRYS ER 20 MEQ PO TBCR: 40.0000 meq | EXTENDED_RELEASE_TABLET | Freq: Two times a day (BID) | ORAL | 0 refills | Status: AC

## 2022-09-12 NOTE — ED Notes (Signed)
Call placed to medicare for preauthorization for CT of abd w contrast as per medicare preauth not required. Ref ID [id number])

## 2022-09-12 NOTE — Discharge Instructions (Addendum)
Your potassium and sodium levels are slightly low.  Potassium supplements and antinausea medicine that was sent to your preferred pharmacy.  Stop by your pharmacy to pick these up.  Your COVID, influenza and RSV are all negative.  You do not have a urinary tract infection.  Your CT did show some small kidney stones vs cysts  in your right kidney.   Go to ED for red flag symptoms, including; fevers you cannot reduce with Tylenol/Motrin, severe headaches, vision changes, numbness/weakness in part of the body, lethargy, confusion, intractable vomiting, severe dehydration, chest pain, breathing difficulty, severe persistent abdominal or pelvic pain, signs of severe infection (increased redness, swelling of an area), feeling faint or passing out, dizziness, etc. You should especially go to the ED for sudden acute worsening of condition if you do not elect to go at this time.

## 2022-09-12 NOTE — ED Triage Notes (Addendum)
Took 2 tabs of dulculax on Saturday because no BM x 3 days. Saturday and Sunday pt was gassy and yesterday pt started experiencing lots of vomiting, diarrhea, cramping and abd pain. Diarrhea was so bad pt was incontinent of stool. Pt also c/o burning upon urination

## 2022-09-12 NOTE — ED Provider Notes (Signed)
[facility]    CSN: [id number] Arrival date & time: 09/12/22  1231      History   Chief Complaint Chief Complaint  Patient presents with   Emesis   Diarrhea   Abdominal Pain    HPI [facility] Carrie Bryant is a 72 y.o. female.   HPI  Carrie Bryant presents for profuse diarrhea, vomiting and gas yesterday. She took Dulcolax for  and constipation for 3 days on Saturday. abdominal cramping. Daughter gave her some electrolytes, pepcid, omeprazole.  Carrie Bryant had to run to the bathroom.  She drank some coffee which caused more diarrhea.  Has decreased appetite. One to two weeks ago her niece had the flu.   Her urine is dark. She has some burning with urination.  No urinary frequency or urgency.       ABDOMINAL PAIN  Location: *** Onset: ***  Radiation: *** Severity: *** Quality: *** Duration: *** Better with: *** Worse with: ***  Past Surgeries: ***  Symptoms Nausea/Vomiting: {YES/NO/WILD CARDS:18581}  Diarrhea: {YES/NO/WILD [id number]  Constipation: {YES/NO/WILD CARDS:18581}  Melena/BRBPR: {YES/NO/WILD CARDS:18581}  Hematemesis: {YES/NO/WILD RC:[id number]  Anorexia: {YES/NO/WILD CARDS:18581}  Fever/Chills: {YES/NO/WILD [id number]  Dysuria: {YES/NO/WILD CARDS:18581}  Rash: {YES/NO/WILD CARDS:18581}  Wt loss: {YES/NO/WILD CARDS:18581}  EtOH use: {YES/NO/WILD CARDS:18581}  NSAIDs/ASA: {YES/NO/WILD CARDS:18581}  LMP: *** Vaginal bleeding: {YES/NO/WILD [id number]  STD risk/hx: {YES/NO/WILD CARDS:18581}  Sore throat: no   Cough: no Nasal congestion : no  Sleep disturbance: no Back Pain: no Headache: no   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/2021   Anemia 06/14/2020   Special  screening for malignant neoplasms, colon    Polyp of transverse colon    Polyp of sigmoid colon    Adenomatous polyp of ascending colon    Age-related osteoporosis without current pathological fracture 04/11/2020   Chronic midline low back pain without sciatica 04/11/2020   Primary osteoarthritis involving multiple joints 04/11/2020   Rheumatoid arthritis of multiple sites with negative rheumatoid factor ([place]) 04/11/2020    Past Surgical History:  Procedure Laterality Date   ABDOMINAL HYSTERECTOMY     BIOPSY  07/18/2020   Procedure: BIOPSY;  Surgeon: Irving Copas., MD;  Location: [place];  Service: Gastroenterology;;   BIOPSY  02/27/2021   Procedure: BIOPSY;  Surgeon: Irving Copas., MD;  Location: [facility];  Service: Gastroenterology;;   COLONOSCOPY WITH PROPOFOL N/A 05/02/2020   Procedure: COLONOSCOPY WITH BIOPSY;  Surgeon: Virgel Manifold, MD;  Location: [place];  Service: Endoscopy;  Laterality: N/A;  PRIORITY 4   COLONOSCOPY WITH PROPOFOL N/A 07/18/2020   Procedure: COLONOSCOPY WITH PROPOFOL;  Surgeon: Rush Landmark Telford Nab., MD;  Location: [place];  Service: Gastroenterology;  Laterality: N/A;   COLONOSCOPY WITH PROPOFOL N/A 02/27/2021   Procedure: COLONOSCOPY WITH PROPOFOL;  Surgeon: Rush Landmark Telford Nab., MD;  Location: [facility];  Service: Gastroenterology;  Laterality: N/A;   ENDOSCOPIC MUCOSAL RESECTION N/A 07/18/2020   Procedure: ENDOSCOPIC MUCOSAL RESECTION;  Surgeon: Rush Landmark Telford Nab., MD;  Location: [place];  Service: Gastroenterology;  Laterality: N/A;   ESOPHAGOGASTRODUODENOSCOPY (EGD) WITH PROPOFOL N/A 07/18/2020   Procedure: ESOPHAGOGASTRODUODENOSCOPY (EGD) WITH PROPOFOL;  Surgeon: Irving Copas., MD;  Location: [place];  Service: Gastroenterology;  Laterality: N/A;   ESOPHAGOGASTRODUODENOSCOPY (EGD) WITH PROPOFOL N/A 02/27/2021   Procedure: ESOPHAGOGASTRODUODENOSCOPY (EGD) WITH PROPOFOL;  Surgeon:  Rush Landmark Telford Nab., MD;  Location: [facility];  Service: Gastroenterology;  Laterality: N/A;   HEMOSTASIS CLIP PLACEMENT  07/18/2020   Procedure: HEMOSTASIS CLIP PLACEMENT;  Surgeon: Irving Copas., MD;  Location: [facility];  Service: Gastroenterology;;   POLYPECTOMY N/A 05/02/2020   Procedure: POLYPECTOMY;  Surgeon: Virgel Manifold, MD;  Location: [place];  Service: Endoscopy;  Laterality: N/A;   POLYPECTOMY  07/18/2020   Procedure: POLYPECTOMY;  Surgeon: Rush Landmark Telford Nab., MD;  Location: [place];  Service: Gastroenterology;;   POLYPECTOMY  02/27/2021   Procedure: POLYPECTOMY;  Surgeon: Irving Copas., MD;  Location: [facility];  Service: Gastroenterology;;   SUBMUCOSAL LIFTING INJECTION  07/18/2020   Procedure: SUBMUCOSAL LIFTING INJECTION;  Surgeon: Irving Copas., MD;  Location: [facility] ENDOSCOPY;  Service: Gastroenterology;;    OB History   No obstetric history on file.      Home Medications    Prior to Admission medications   Medication Sig Start Date End Date Taking? Authorizing Provider  albuterol (VENTOLIN HFA) 108 (90 Base) MCG/ACT inhaler Inhale 2 puffs into the lungs every 4 (four) hours as needed for wheezing or shortness of breath. 01/07/20   LampteyMyrene Galas, MD  Cholecalciferol (VITAMIN D-3) 25 MCG (1000 UT) CAPS Take 1,000 Units by mouth daily.    [provider]  clonazePAM (KLONOPIN) 2 MG tablet Take 2 mg by mouth at bedtime.    [provider]  escitalopram (LEXAPRO) 20 MG tablet Take 20 mg by mouth daily. 09/05/21   [provider]  esomeprazole (NEXIUM) 40 MG capsule TAKE 1 CAPSULE (40 MG TOTAL) BY MOUTH DAILY AT 12 NOON. 02/07/22   Mansouraty, Telford Nab., MD  estradiol (ESTRACE) 0.1 MG/GM vaginal cream Place 1 Applicatorful vaginally at bedtime. Estrogen Cream Instruction Discard applicator Apply pea sized amount to tip of finger to urethra before bed. Wash hands well after application.  Use Monday, Wednesday and Friday 10/17/21   Hollice Espy, MD  ferrous sulfate 325 (65 FE) MG tablet Take 325 mg by mouth daily.    [provider]  fluticasone (FLONASE) 50 MCG/ACT nasal spray Place 2 sprays into both nostrils 2 (two) times daily. 02/08/21   [provider]  furosemide (LASIX) 20 MG tablet Take 20 mg by mouth daily. 12/15/19   [provider]  gabapentin (NEURONTIN) 100 MG capsule Take 100 mg by mouth at bedtime. 09/11/21   [provider]  ibuprofen (ADVIL) 400 MG tablet Take 1 tablet (400 mg total) by mouth every 6 (six) hours as needed. 05/08/22   Lyndee Hensen, DO  levothyroxine (SYNTHROID) 25 MCG tablet Take 25 mcg by mouth daily before breakfast. 02/21/20   [provider]  montelukast (SINGULAIR) 10 MG tablet Take 10 mg by mouth at bedtime. 12/15/19   [provider]  Multiple Vitamin (MULTIVITAMIN) tablet Take 1 tablet by mouth daily.    [provider]  simvastatin (ZOCOR) 20 MG tablet Take 20 mg by mouth every evening.    [provider]  simvastatin (ZOCOR) 40 MG tablet SMARTSIG:1 Tablet(s) By Mouth Every Evening 10/16/21   [provider]  tamsulosin (FLOMAX) 0.4 MG CAPS capsule Take 1 capsule (0.4 mg total) by mouth daily. Patient not taking: Reported on 01/16/2022 01/01/21   Margarette Canada, NP  tiZANidine (ZANAFLEX)  4 MG tablet Take 1 tablet (4 mg total) by mouth every 6 (six) hours as needed for muscle spasms. 05/08/22   Lyndee Hensen, DO  traZODone (DESYREL) 150 MG tablet Take 150 mg by mouth at bedtime.    [provider]  valsartan (DIOVAN) 80 MG tablet Take 80 mg by mouth daily. 12/27/19   [provider]  vitamin B-12 (CYANOCOBALAMIN) 1000 MCG tablet Take 1,000 mcg by mouth daily.    [provider]    Family History Family History  Problem Relation Age of Onset   Colon cancer Neg Hx    Esophageal cancer Neg Hx    Inflammatory bowel disease Neg Hx    Liver  disease Neg Hx    Pancreatic cancer Neg Hx    Rectal cancer Neg Hx    Stomach cancer Neg Hx     Social History Social History   Tobacco Use   Smoking status: Never   Smokeless tobacco: Never  Vaping Use   Vaping Use: Never used  Substance Use Topics   Alcohol use: Never   Drug use: Never     Allergies   Morphine and related and Codeine   Review of Systems Review of Systems :negative unless otherwise stated in HPI.      Physical Exam Triage Vital Signs ED Triage Vitals  Enc Vitals Group     BP 09/12/22 1332 135/82     Pulse Rate 09/12/22 1332 73     Resp 09/12/22 1332 20     Temp 09/12/22 1332 98.5 F (36.9 C)     Temp src --      SpO2 09/12/22 1332 95 %     Weight --      Height --      Head Circumference --      Peak Flow --      Pain Score 09/12/22 1334 9     Pain Loc --      Pain Edu? --      Excl. in [place]? --    No data found.  Updated Vital Signs BP 135/82   Pulse 73   Temp 98.5 F (36.9 C)   Resp 20   SpO2 95%   Visual Acuity Right Eye Distance:   Left Eye Distance:   Bilateral Distance:    Right Eye Near:   Left Eye Near:    Bilateral Near:     Physical Exam  GEN: pleasant well appearing female, in no acute distress *** CV: regular rate and rhythm, no murmurs appreciated *** RESP: no increased work of breathing, clear to ascultation bilaterally ABD: Bowel sounds present. Soft,***non-tender,***non-distended. ***No guarding,***no rebound,***no appreciable hepatosplenomegaly,***no CVA tenderness,***negative McBurney's,***negative Murphy MSK: no extremity edema SKIN: warm, dry, no rash on visible skin NEURO: alert, moves all extremities appropriately PSYCH: Normal affect, appropriate speech and behavior   UC Treatments / Results  Labs (all labs ordered are listed, but only abnormal results are displayed) Labs Reviewed  URINALYSIS, ROUTINE W REFLEX MICROSCOPIC - Abnormal; Notable for the following components:      Result Value   Hgb  urine dipstick SMALL (*)    Bilirubin Urine SMALL (*)    Protein, ur 30 (*)    All other components within normal limits  URINALYSIS, MICROSCOPIC (REFLEX) - Abnormal; Notable for the following components:   Bacteria, UA FEW (*)    All other components within normal limits    EKG   Radiology No results found.  Procedures Procedures (including critical  care time)  Medications Ordered in UC Medications - No data to display  Initial Impression / Assessment and Plan / UC Course  I have reviewed the triage vital signs and the nursing notes.  Pertinent labs & imaging results that were available during my care of the patient were reviewed by me and considered in my medical decision making (see chart for details).     ***  Patient is a  72 y.o. femalewith history *** who presents after having insidious severe abdominal pain about *** ago.  Overall, patient is well-appearing, well-hydrated, and in no acute distress.  Vital signs stable.  [place] afebrile.  Exam is ***not concerning for an acute abdomen.  Obtained UA, urine pregnancy***, CBC, CMP, and lipase.  No personal history of kidney stones.      DDX:  -UTI: Urine unremarkable -STI: Not sexually active -Ovarian torsion versus cyst: Pain is epigastric less likely -Biliary colic/gallstone: Labs today, consider right upper quadrant ultrasound -Pancreatitis: Lipase today -Gastroenteritis: Not likely given no vomiting or diarrhea -Kidney stone: No hematuria on UA, no CVA tenderness -Constipation: None reported -Appendicitis: status post recent appendectomy   Constipation: ***Provided and reviewed constipation clean out and maintenance plan with patient and ***. Discussed eating small meals frequently and increased fluid intake, as well as gradual increase in fiber intake with dried fruits, vegetables with skins, beans, whole grains and cereal. Goal ***25-35g of fiber per day. Encouraged drinking hot beverages and prune juice; add  probiotic-containing foods like pasteurized yogurt and kefir; encourage increased physical activity.     Follow-up, return and ED precautions given.  Discussed MDM, treatment plan and plan for follow-up with patient/parent who agrees with plan.    Final Clinical Impressions(s) / UC Diagnoses   Final diagnoses:  None   Discharge Instructions   None    ED Prescriptions   None    PDMP not reviewed this encounter.

## 2022-12-24 ENCOUNTER — Other Ambulatory Visit: Payer: Self-pay | Admitting: Gastroenterology

## 2023-01-16 NOTE — Progress Notes (Deleted)
01/17/2023 10:59 AM   Carrie Bryant February 11, 1951 [id number]  Referring provider: Preston Fleeting, MD [address] [place],  [place] [address]  Urological history: 1.  Nephrolithiasis -remote history - URS/MET -non-contrast CT, 07/2021 - bilateral nephrolithiasis  2. Pelvic pain/dysuria -likely secondary to vaginal atrophy -cysto, 09/2021 - NED -urine cytology, 09/2021 - negative  3. Renal cysts -Contrast CT (08/2022)  -  Multiple BILATERAL renal cysts  No chief complaint on file.  HPI: Carrie Bryant is a 72 y.o. female who presents today for a one month follow up.    Reviewed previous records  She was seen in the ED for abdominal pain and diarrhea, contrast CT was obtained which noted multiple bilateral cysts.  It also noted some calcifications within both the left and right kidney which are likely parenchymal calcifications.       PMH: Past Medical History:  Diagnosis Date   Anemia    Anxiety    Arthritis    Asthma    Depression    History of kidney stones    Hypercholesterolemia    Hypertension    Kidney stones    Thyroid disease     Surgical History: Past Surgical History:  Procedure Laterality Date   ABDOMINAL HYSTERECTOMY     BIOPSY  07/18/2020   Procedure: BIOPSY;  Surgeon: Lemar Lofty., MD;  Location: [facility] ENDOSCOPY;  Service: Gastroenterology;;   BIOPSY  02/27/2021   Procedure: BIOPSY;  Surgeon: Lemar Lofty., MD;  Location: [facility];  Service: Gastroenterology;;   COLONOSCOPY WITH PROPOFOL N/A 05/02/2020   Procedure: COLONOSCOPY WITH BIOPSY;  Surgeon: Pasty Spillers, MD;  Location: [facility];  Service: Endoscopy;  Laterality: N/A;  PRIORITY 4   COLONOSCOPY WITH PROPOFOL N/A 07/18/2020   Procedure: COLONOSCOPY WITH PROPOFOL;  Surgeon: Meridee Score Netty Starring., MD;  Location: [facility] ENDOSCOPY;  Service: Gastroenterology;  Laterality: N/A;   COLONOSCOPY WITH PROPOFOL N/A 02/27/2021   Procedure:  COLONOSCOPY WITH PROPOFOL;  Surgeon: Meridee Score Netty Starring., MD;  Location: [facility];  Service: Gastroenterology;  Laterality: N/A;   ENDOSCOPIC MUCOSAL RESECTION N/A 07/18/2020   Procedure: ENDOSCOPIC MUCOSAL RESECTION;  Surgeon: Meridee Score Netty Starring., MD;  Location[facility];  Service: Gastroenterology;  Laterality: N/A;   ESOPHAGOGASTRODUODENOSCOPY (EGD) WITH PROPOFOL N/A 07/18/2020   Procedure: ESOPHAGOGASTRODUODENOSCOPY (EGD) WITH PROPOFOL;  Surgeon: Meridee Score Netty Starring., MD;  Location: [facility] ENDOSCOPY;  Service: Gastroenterology;  Laterality: N/A;   ESOPHAGOGASTRODUODENOSCOPY (EGD) WITH PROPOFOL N/A 02/27/2021   Procedure: ESOPHAGOGASTRODUODENOSCOPY (EGD) WITH PROPOFOL;  Surgeon: Meridee Score Netty Starring., MD;  Location: [facility];  Service: Gastroenterology;  Laterality: N/A;   HEMOSTASIS CLIP PLACEMENT  07/18/2020   Procedure: HEMOSTASIS CLIP PLACEMENT;  Surgeon: Lemar Lofty., MD;  Location: [facility];  Service: Gastroenterology;;   POLYPECTOMY N/A 05/02/2020   Procedure: POLYPECTOMY;  Surgeon: Pasty Spillers, MD;  Location: [facility];  Service: Endoscopy;  Laterality: N/A;   POLYPECTOMY  07/18/2020   Procedure: POLYPECTOMY;  Surgeon: Meridee Score Netty Starring., MD;  Location: [facility] ENDOSCOPY;  Service: Gastroenterology;;   POLYPECTOMY  02/27/2021   Procedure: POLYPECTOMY;  Surgeon: Lemar Lofty., MD;  Location: [facility];  Service: Gastroenterology;;   SUBMUCOSAL LIFTING INJECTION  07/18/2020   Procedure: SUBMUCOSAL LIFTING INJECTION;  Surgeon: Lemar Lofty., MD;  Location: [facility] ENDOSCOPY;  Service: Gastroenterology;;    Home Medications:  Allergies as of 01/17/2023       Reactions   Morphine And Codeine Anaphylaxis, Other (See Comments)   dizziness  Codeine Anxiety, Other (See Comments)   Hypertension (elevates BP) & chest pain.        Medication List        Accurate as of January 16, 2023 10:59 AM. If you have any questions,  ask your nurse or doctor.          albuterol 108 (90 Base) MCG/ACT inhaler Commonly known as: VENTOLIN HFA Inhale 2 puffs into the lungs every 4 (four) hours as needed for wheezing or shortness of breath.   clonazePAM 2 MG tablet Commonly known as: KLONOPIN Take 2 mg by mouth at bedtime.   cyanocobalamin 1000 MCG tablet Commonly known as: VITAMIN B12 Take 1,000 mcg by mouth daily.   escitalopram 20 MG tablet Commonly known as: LEXAPRO Take 20 mg by mouth daily.   esomeprazole 40 MG capsule Commonly known as: NEXIUM TAKE 1 CAPSULE (40 MG TOTAL) BY MOUTH DAILY AT 12 NOON.   estradiol 0.1 MG/GM vaginal cream Commonly known as: ESTRACE Place 1 Applicatorful vaginally at bedtime. Estrogen Cream Instruction Discard applicator Apply pea sized amount to tip of finger to urethra before bed. Wash hands well after application. Use Monday, Wednesday and Friday   ferrous sulfate 325 (65 FE) MG tablet Take 325 mg by mouth daily.   fluticasone 50 MCG/ACT nasal spray Commonly known as: FLONASE Place 2 sprays into both nostrils 2 (two) times daily.   furosemide 20 MG tablet Commonly known as: LASIX Take 20 mg by mouth daily.   gabapentin 100 MG capsule Commonly known as: NEURONTIN Take 100 mg by mouth at bedtime.   ibuprofen 400 MG tablet Commonly known as: ADVIL Take 1 tablet (400 mg total) by mouth every 6 (six) hours as needed.   levothyroxine 25 MCG tablet Commonly known as: SYNTHROID Take 25 mcg by mouth daily before breakfast.   montelukast 10 MG tablet Commonly known as: SINGULAIR Take 10 mg by mouth at bedtime.   multivitamin tablet Take 1 tablet by mouth daily.   ondansetron 4 MG disintegrating tablet Commonly known as: ZOFRAN-ODT Take 1 tablet (4 mg total) by mouth every 8 (eight) hours as needed.   potassium chloride SA 20 MEQ tablet Commonly known as: KLOR-CON M Take 2 tablets (40 mEq total) by mouth 2 (two) times daily for 3 doses.   simvastatin 20 MG  tablet Commonly known as: ZOCOR Take 20 mg by mouth every evening.   simvastatin 40 MG tablet Commonly known as: ZOCOR SMARTSIG:1 Tablet(s) By Mouth Every Evening   tamsulosin 0.4 MG Caps capsule Commonly known as: FLOMAX Take 1 capsule (0.4 mg total) by mouth daily.   tiZANidine 4 MG tablet Commonly known as: Zanaflex Take 1 tablet (4 mg total) by mouth every 6 (six) hours as needed for muscle spasms.   traZODone 150 MG tablet Commonly known as: DESYREL Take 150 mg by mouth at bedtime.   valsartan 80 MG tablet Commonly known as: DIOVAN Take 80 mg by mouth daily.   Vitamin D-3 25 MCG (1000 UT) Caps Take 1,000 Units by mouth daily.        Allergies:  Allergies  Allergen Reactions   Morphine And Codeine Anaphylaxis and Other (See Comments)    dizziness   Codeine Anxiety and Other (See Comments)    Hypertension (elevates BP) & chest pain.    Family History: Family History  Problem Relation Age of Onset   Colon cancer Neg Hx    Esophageal cancer Neg Hx    Inflammatory bowel disease Neg Hx  Liver disease Neg Hx    Pancreatic cancer Neg Hx    Rectal cancer Neg Hx    Stomach cancer Neg Hx     Social History:  reports that she has never smoked. She has never used smokeless tobacco. She reports that she does not drink alcohol and does not use drugs.  ROS: Pertinent ROS in HPI  Physical Exam: There were no vitals taken for this visit.  Constitutional:  Well nourished. Alert and oriented, No acute distress. HEENT: [place] AT, moist mucus membranes.  Trachea midline, no masses. Cardiovascular: No clubbing, cyanosis, or edema. Respiratory: Normal respiratory effort, no increased work of breathing. GU: No CVA tenderness.  No bladder fullness or masses. Vulvovaginal atrophy w/ pallor, loss of rugae, introital retraction, excoriations.  Vulvar thinning, fusion of labia, clitoral hood retraction, prominent urethral meatus.   *** external genitalia, *** pubic hair  distribution, no lesions.  Normal urethral meatus, no lesions, no prolapse, no discharge.   No urethral masses, tenderness and/or tenderness. No bladder fullness, tenderness or masses. *** vagina mucosa, *** estrogen effect, no discharge, no lesions, *** pelvic support, *** cystocele and *** rectocele noted.  No cervical motion tenderness.  Uterus is freely mobile and non-fixed.  No adnexal/parametria masses or tenderness noted.  Anus and perineum are without rashes or lesions.   ***  Neurologic: Grossly intact, no focal deficits, moving all 4 extremities. Psychiatric: Normal mood and affect.    Laboratory Data: CMP     Component Value Date/Time   NA 133 (L) 09/12/2022 1443   K 3.0 (L) 09/12/2022 1443   CL 103 09/12/2022 1443   CO2 22 09/12/2022 1443   GLUCOSE 101 (H) 09/12/2022 1443   BUN 9 09/12/2022 1443   CREATININE 0.75 09/12/2022 1443   CALCIUM 7.7 (L) 09/12/2022 1443   PROT 7.5 09/12/2022 1443   ALBUMIN 4.0 09/12/2022 1443   AST 29 09/12/2022 1443   ALT 20 09/12/2022 1443   ALKPHOS 66 09/12/2022 1443   BILITOT 0.4 09/12/2022 1443   GFR 81.95 01/31/2021 1112   GFRNONAA >60 09/12/2022 1443   Component     Latest Ref Rng 09/12/2022  Glucose, UA     NEGATIVE mg/dL NEGATIVE   Bilirubin Urine     NEGATIVE  SMALL !   Ketones, ur     NEGATIVE mg/dL NEGATIVE   Specific Gravity, Urine     1.005 - 1.030  1.015   Hgb urine dipstick     NEGATIVE  SMALL !   pH     5.0 - 8.0  6.0   Protein     NEGATIVE mg/dL 30 !   Urobilinogen, UA     0.0 - 1.0    Nitrite     NEGATIVE  NEGATIVE   Leukocytes,Ua     NEGATIVE  NEGATIVE   Color, Urine     YELLOW  YELLOW   Appearance     CLEAR  CLEAR   Total Protein, Urine-UPE24     Negative    Urine Glucose     Negative    WBC, UA     0-2/hpf    RBC / HPF     0-2/hpf    Squamous Epithelial / HPF     Rare(0-4/hpf)    Specific Gravity, UA     1.005 - 1.030    pH, UA     5.0 - 7.5    Color, UA     Yellow    Appearance Ur  Clear     Leukocytes,UA     Negative    Protein,UA     Negative/Trace    Ketones, UA     Negative    RBC, UA     Negative    Bilirubin, UA     Negative    Urobilinogen, Ur     0.2 - 1.0 mg/dL   Nitrite, UA     Negative    Microscopic Examination     Legend: ! Abnormal  Component     Latest Ref Rng 09/12/2022  WBC, UA     0 - 5 WBC/hpf 0-5   RBC / HPF     0 - 5 RBC/hpf 0-5   Squamous Epithelial / HPF     0 - 5 /HPF 0-5   Bacteria, UA     NONE SEEN  FEW !   Hyaline Casts, UA PRESENT     Legend: ! Abnormal I have reviewed the labs.   Pertinent Imaging: N/A  Assessment & Plan:    1. Vaginal atrophy -Continue vaginal estrogen cream 2 times weekly  2. Bilateral renal cysts -appear benign and do require follow up  3. Nephrolithiasis -recent CT demonstrated small calcifications scattered throughout the right kidney which are likely embedded in the parenchyma and some layering material and a cyst in the left kidney that is likely milk of calcium -She is asymptomatic at this time  No follow-ups on file.  These notes generated with voice recognition software. I apologize for typographical errors.  Cloretta Ned  [facility] [address]  [address] [place], [place] [address] [phone]

## 2023-01-17 ENCOUNTER — Ambulatory Visit: Payer: 59 | Admitting: Urology

## 2023-01-17 DIAGNOSIS — N952 Postmenopausal atrophic vaginitis: Secondary | ICD-10-CM

## 2023-01-17 DIAGNOSIS — N200 Calculus of kidney: Secondary | ICD-10-CM

## 2023-01-17 DIAGNOSIS — N281 Cyst of kidney, acquired: Secondary | ICD-10-CM

## 2023-01-30 ENCOUNTER — Other Ambulatory Visit: Payer: Self-pay | Admitting: Gastroenterology

## 2023-02-07 NOTE — Progress Notes (Unsigned)
02/08/2023 4:38 PM   Carrie Bryant 1951-04-28 [id number]  Referring provider: Preston Fleeting, MD [address] [place],  [place] [address]  Urological history: 1.  Nephrolithiasis -remote history - URS/MET -non-contrast CT, 07/2021 - bilateral nephrolithiasis -CT (08/2022) -nonobstructing right renal calculi  2. Pelvic pain/dysuria -likely secondary to vaginal atrophy -cysto, 09/2021 - NED -urine cytology, 09/2021 - negative  3. Renal cysts -Contrast CT (08/2022)  -  Multiple BILATERAL renal cysts  Chief Complaint  Patient presents with   Follow-up    1 year follow-up   HPI: Carrie Bryant is a 72 y.o. female who presents today for a one year follow up with her daughter, Camila Li and interpreter, Marchelle Folks.    Reviewed previous records  She was seen in the ED in March 2024 for abdominal pain and diarrhea, contrast CT was obtained which noted multiple bilateral cysts.  It also noted some calcifications within both the left and right kidney which are likely parenchymal calcifications.  She is having 8 or more daytime voids, nocturia x 1-2 with a mild urge to urinate.  She wears panty liners daily.  She does not limit fluid intake.  She does engage in toilet mapping.  She has been experiencing suprapubic pain along with lower back pain for 1 month.  This is been associated with painful urination.  Patient denies any modifying or aggravating factors.  Patient denies any recent UTI's, gross hematuria, dysuria or suprapubic/flank pain.  Patient denies any fevers, chills, nausea or vomiting.   She was seen recently at her primary care doctors office where they did a dip and was told she had trace blood.  They did not start her on any medication.  She has been applying vaginal estrogen cream 3 nights weekly for the last year.  CATH UA yellow clear, specific gravity 1.010, pH 5.5, trace RBC, 0-5 WBC's, 0-2 RBC's, 0-10 epithelial cells and few bacteria.     PMH: Past Medical History:  Diagnosis Date   Anemia    Anxiety    Arthritis    Asthma    Depression    History of kidney stones    Hypercholesterolemia    Hypertension    Kidney stones    Thyroid disease     Surgical History: Past Surgical History:  Procedure Laterality Date   ABDOMINAL HYSTERECTOMY     BIOPSY  07/18/2020   Procedure: BIOPSY;  Surgeon: Lemar Lofty., MD;  Location: [facility] ENDOSCOPY;  Service: Gastroenterology;;   BIOPSY  02/27/2021   Procedure: BIOPSY;  Surgeon: Lemar Lofty., MD;  Location: [facility] ENDOSCOPY;  Service: Gastroenterology;;   COLONOSCOPY WITH PROPOFOL N/A 05/02/2020   Procedure: COLONOSCOPY WITH BIOPSY;  Surgeon: Pasty Spillers, MD;  Location: [facility]GERY [facility];  Service: Endoscopy;  Laterality: N/A;  PRIORITY 4   COLONOSCOPY WITH PROPOFOL N/A 07/18/2020   Procedure: COLONOSCOPY WITH PROPOFOL;  Surgeon: Meridee Score Netty Starring., MD;  Location: [facility] ENDOSCOPY;  Service: Gastroenterology;  Laterality: N/A;   COLONOSCOPY WITH PROPOFOL N/A 02/27/2021   Procedure: COLONOSCOPY WITH PROPOFOL;  Surgeon: Meridee Score Netty Starring., MD;  Location: [facility] ENDOSCOPY;  Service: Gastroenterology;  Laterality: N/A;   ENDOSCOPIC MUCOSAL RESECTION N/A 07/18/2020   Procedure: ENDOSCOPIC MUCOSAL RESECTION;  Surgeon: Meridee Score Netty Starring., MD;  Location: [facility] ENDOSCOPY;  Service: Gastroenterology;  Laterality: N/A;   ESOPHAGOGASTRODUODENOSCOPY (EGD) WITH PROPOFOL N/A 07/18/2020   Procedure: ESOPHAGOGASTRODUODENOSCOPY (EGD) WITH PROPOFOL;  Surgeon: Meridee Score Netty Starring., MD;  Location: [facility] ENDOSCOPY;  Service: Gastroenterology;  Laterality: N/A;   ESOPHAGOGASTRODUODENOSCOPY (EGD) WITH PROPOFOL N/A 02/27/2021   Procedure: ESOPHAGOGASTRODUODENOSCOPY (EGD) WITH PROPOFOL;  Surgeon: Meridee Score Netty Starring., MD;  Location: [facility] ENDOSCOPY;  Service: Gastroenterology;  Laterality: N/A;   HEMOSTASIS CLIP PLACEMENT  07/18/2020   Procedure: HEMOSTASIS CLIP PLACEMENT;  Surgeon:  Lemar Lofty., MD;  Location: [facility] ENDOSCOPY;  Service: Gastroenterology;;   POLYPECTOMY N/A 05/02/2020   Procedure: POLYPECTOMY;  Surgeon: Pasty Spillers, MD;  Location: [facility]ERY [facility];  Service: Endoscopy;  Laterality: N/A;   POLYPECTOMY  07/18/2020   Procedure: POLYPECTOMY;  Surgeon: Meridee Score Netty Starring., MD;  Location: [facility] ENDOSCOPY;  Service: Gastroenterology;;   POLYPECTOMY  02/27/2021   Procedure: POLYPECTOMY;  Surgeon: Lemar Lofty., MD;  Location: [facility] ENDOSCOPY;  Service: Gastroenterology;;   SUBMUCOSAL LIFTING INJECTION  07/18/2020   Procedure: SUBMUCOSAL LIFTING INJECTION;  Surgeon: Lemar Lofty., MD;  Location: [facility] ENDOSCOPY;  Service: Gastroenterology;;    Home Medications:  Allergies as of 02/08/2023       Reactions   Morphine And Codeine Anaphylaxis, Other (See Comments)   dizziness   Codeine Anxiety, Other (See Comments)   Hypertension (elevates BP) & chest pain.        Medication List        Accurate as of February 08, 2023 11:59 PM. If you have any questions, ask your nurse or doctor.          albuterol 108 (90 Base) MCG/ACT inhaler Commonly known as: VENTOLIN HFA Inhale 2 puffs into the lungs every 4 (four) hours as needed for wheezing or shortness of breath.   clonazePAM 2 MG tablet Commonly known as: KLONOPIN Take 2 mg by mouth at bedtime.   cyanocobalamin 1000 MCG tablet Commonly known as: VITAMIN B12 Take 1,000 mcg by mouth daily.   escitalopram 20 MG tablet Commonly known as: LEXAPRO Take 20 mg by mouth daily.   esomeprazole 40 MG capsule Commonly known as: NEXIUM TAKE 1 CAPSULE (40 MG TOTAL) BY MOUTH DAILY AT 12 NOON.   estradiol 0.1 MG/GM vaginal cream Commonly known as: ESTRACE Place 1 Applicatorful vaginally at bedtime. Estrogen Cream Instruction Discard applicator Apply pea sized amount to tip of finger to urethra before bed. Wash hands well after application. Use Monday, Wednesday and Friday   ferrous  sulfate 325 (65 FE) MG tablet Take 325 mg by mouth daily.   fluticasone 50 MCG/ACT nasal spray Commonly known as: FLONASE Place 2 sprays into both nostrils 2 (two) times daily.   furosemide 20 MG tablet Commonly known as: LASIX Take 20 mg by mouth daily.   gabapentin 100 MG capsule Commonly known as: NEURONTIN Take 100 mg by mouth at bedtime.   ibuprofen 400 MG tablet Commonly known as: ADVIL Take 1 tablet (400 mg total) by mouth every 6 (six) hours as needed.   levothyroxine 25 MCG tablet Commonly known as: SYNTHROID Take 25 mcg by mouth daily before breakfast.   montelukast 10 MG tablet Commonly known as: SINGULAIR Take 10 mg by mouth at bedtime.   multivitamin tablet Take 1 tablet by mouth daily.   ondansetron 4 MG disintegrating tablet Commonly known as: ZOFRAN-ODT Take 1 tablet (4 mg total) by mouth every 8 (eight) hours as needed.   potassium chloride SA 20 MEQ tablet Commonly known as: KLOR-CON M Take 2 tablets (40 mEq total) by mouth 2 (two) times daily for 3 doses.   simvastatin 40 MG tablet Commonly known as: ZOCOR SMARTSIG:1 Tablet(s) By Mouth Every Evening   tamsulosin 0.4  MG Caps capsule Commonly known as: FLOMAX Take 1 capsule (0.4 mg total) by mouth daily.   tiZANidine 4 MG tablet Commonly known as: Zanaflex Take 1 tablet (4 mg total) by mouth every 6 (six) hours as needed for muscle spasms.   traZODone 150 MG tablet Commonly known as: DESYREL Take 150 mg by mouth at bedtime.   valsartan 80 MG tablet Commonly known as: DIOVAN Take 80 mg by mouth daily.   Vitamin D-3 25 MCG (1000 UT) Caps Take 1,000 Units by mouth daily.        Allergies:  Allergies  Allergen Reactions   Morphine And Codeine Anaphylaxis and Other (See Comments)    dizziness   Codeine Anxiety and Other (See Comments)    Hypertension (elevates BP) & chest pain.    Family History: Family History  Problem Relation Age of Onset   Colon cancer Neg Hx    Esophageal  cancer Neg Hx    Inflammatory bowel disease Neg Hx    Liver disease Neg Hx    Pancreatic cancer Neg Hx    Rectal cancer Neg Hx    Stomach cancer Neg Hx     Social History:  reports that she has never smoked. She has never been exposed to tobacco smoke. She has never used smokeless tobacco. She reports that she does not drink alcohol and does not use drugs.  ROS: Pertinent ROS in HPI  Physical Exam: BP 130/72   Pulse (!) 57   Ht 4\' 11"  (1.499 m)   Wt 147 lb (66.7 kg)   BMI 29.69 kg/m   Constitutional:  Well nourished. Alert and oriented, No acute distress. HEENT: [place] AT, moist mucus membranes.  Trachea midline Cardiovascular: No clubbing, cyanosis, or edema. Respiratory: Normal respiratory effort, no increased work of breathing. GU: No CVA tenderness.  No bladder fullness or masses.  Recession of labia minora, dry, pale vulvar vaginal mucosa and loss of mucosal ridges and folds.  Normal urethral meatus, no lesions, no prolapse, no discharge.   No urethral masses, tenderness and/or tenderness. No bladder fullness, tenderness or masses. Pale vagina mucosa, fair estrogen effect, no discharge, no lesions, good pelvic support, no cystocele and no rectocele noted.  Anus and perineum are without rashes or lesions.    Neurologic: Grossly intact, no focal deficits, moving all 4 extremities. Psychiatric: Normal mood and affect.    Laboratory Data: CMP     Component Value Date/Time   NA 133 (L) 09/12/2022 1443   K 3.0 (L) 09/12/2022 1443   CL 103 09/12/2022 1443   CO2 22 09/12/2022 1443   GLUCOSE 101 (H) 09/12/2022 1443   BUN 9 09/12/2022 1443   CREATININE 0.75 09/12/2022 1443   CALCIUM 7.7 (L) 09/12/2022 1443   PROT 7.5 09/12/2022 1443   ALBUMIN 4.0 09/12/2022 1443   AST 29 09/12/2022 1443   ALT 20 09/12/2022 1443   ALKPHOS 66 09/12/2022 1443   BILITOT 0.4 09/12/2022 1443   GFR 81.95 01/31/2021 1112   GFRNONAA >60 09/12/2022 1443   Component     Latest Ref Rng 09/12/2022   Glucose, UA     NEGATIVE mg/dL NEGATIVE   Bilirubin Urine     NEGATIVE  SMALL !   Ketones, ur     NEGATIVE mg/dL NEGATIVE   Specific Gravity, Urine     1.005 - 1.030  1.015   Hgb urine dipstick     NEGATIVE  SMALL !   pH     5.0 -  8.0  6.0   Protein     NEGATIVE mg/dL 30 !   Urobilinogen, UA     0.0 - 1.0    Nitrite     NEGATIVE  NEGATIVE   Leukocytes,Ua     NEGATIVE  NEGATIVE   Color, Urine     YELLOW  YELLOW   Appearance     CLEAR  CLEAR   Total Protein, Urine-UPE24     Negative    Urine Glucose     Negative    WBC, UA     0-2/hpf    RBC / HPF     0-2/hpf    Squamous Epithelial / HPF     Rare(0-4/hpf)    Specific Gravity, UA     1.005 - 1.030    pH, UA     5.0 - 7.5    Color, UA     Yellow    Appearance Ur     Clear    Leukocytes,UA     Negative    Protein,UA     Negative/Trace    Ketones, UA     Negative    RBC, UA     Negative    Bilirubin, UA     Negative    Urobilinogen, Ur     0.2 - 1.0 mg/dL   Nitrite, UA     Negative    Microscopic Examination     Legend: ! Abnormal  Component     Latest Ref Rng 09/12/2022  WBC, UA     0 - 5 WBC/hpf 0-5   RBC / HPF     0 - 5 RBC/hpf 0-5   Squamous Epithelial / HPF     0 - 5 /HPF 0-5   Bacteria, UA     NONE SEEN  FEW !   Hyaline Casts, UA PRESENT     Legend: ! Abnormal  Results for orders placed or performed in visit on 02/08/23  CULTURE, URINE COMPREHENSIVE   Specimen: Urine   UR  Result Value Ref Range   Urine Culture, Comprehensive Preliminary report    Organism ID, Bacteria Comment   Microscopic Examination   Urine  Result Value Ref Range   WBC, UA 0-5 0 - 5 /hpf   RBC, Urine 0-2 0 - 2 /hpf   Epithelial Cells (non renal) 0-10 0 - 10 /hpf   Bacteria, UA Few None seen/Few  Urinalysis, Complete  Result Value Ref Range   Specific Gravity, UA 1.010 1.005 - 1.030   pH, UA 5.5 5.0 - 7.5   Color, UA Yellow Yellow   Appearance Ur Clear Clear   Leukocytes,UA Negative Negative    Protein,UA Negative Negative/Trace   Glucose, UA Negative Negative   Ketones, UA Negative Negative   RBC, UA Trace (A) Negative   Bilirubin, UA Negative Negative   Urobilinogen, Ur 0.2 0.2 - 1.0 mg/dL   Nitrite, UA Negative Negative   Microscopic Examination See below:   I have reviewed the labs.   Pertinent Imaging: N/A   Procedure: In and Out Catheterization Patient is present today for a I & O catheterization due to dysuria. Patient was cleaned and prepped in a sterile fashion with betadine . A 14 FR cath was inserted no complications were noted , 50 ml of urine return was noted, urine was yellow in color. A clean urine sample was collected for urinalysis and urine culture. Bladder was drained  And catheter was removed with out difficulty.    Assessment &  Plan:    1. Vaginal atrophy -Continue vaginal estrogen cream 3 times weekly  2. Bilateral renal cysts -appear benign and do require follow up  3. Nephrolithiasis -recent CT demonstrated small calcifications scattered throughout the right kidney which are likely embedded in the parenchyma and some layering material and a cyst in the left kidney that is likely milk of calcium -I will send her urine for culture, if it is positive we will go ahead and treat and reassess, if it is negative we will pursue a CT scan to see if any of these calcifications have migrated into her ureters   Return for Pending urine culture results .  These notes generated with voice recognition software. I apologize for typographical errors.  Cloretta Ned  [facility] [address]  [address] [place], [place] [address] [phone]

## 2023-02-08 ENCOUNTER — Encounter: Payer: Self-pay | Admitting: Urology

## 2023-02-08 ENCOUNTER — Ambulatory Visit (INDEPENDENT_AMBULATORY_CARE_PROVIDER_SITE_OTHER): Payer: 59 | Admitting: Urology

## 2023-02-08 VITALS — BP 130/72 | HR 57 | Ht 59.0 in | Wt 147.0 lb

## 2023-02-08 DIAGNOSIS — R300 Dysuria: Secondary | ICD-10-CM | POA: Diagnosis not present

## 2023-02-08 DIAGNOSIS — N200 Calculus of kidney: Secondary | ICD-10-CM | POA: Diagnosis not present

## 2023-02-08 DIAGNOSIS — N952 Postmenopausal atrophic vaginitis: Secondary | ICD-10-CM | POA: Diagnosis not present

## 2023-02-08 DIAGNOSIS — N281 Cyst of kidney, acquired: Secondary | ICD-10-CM | POA: Diagnosis not present

## 2023-02-08 LAB — URINALYSIS, COMPLETE
Bilirubin, UA: NEGATIVE
Glucose, UA: NEGATIVE
Ketones, UA: NEGATIVE
Leukocytes,UA: NEGATIVE
Nitrite, UA: NEGATIVE
Protein,UA: NEGATIVE
Specific Gravity, UA: 1.01 (ref 1.005–1.030)
Urobilinogen, Ur: 0.2 mg/dL (ref 0.2–1.0)
pH, UA: 5.5 (ref 5.0–7.5)

## 2023-02-08 LAB — MICROSCOPIC EXAMINATION

## 2023-03-04 ENCOUNTER — Telehealth: Payer: Self-pay | Admitting: Urology

## 2023-03-04 DIAGNOSIS — N200 Calculus of kidney: Secondary | ICD-10-CM

## 2023-03-04 NOTE — Telephone Encounter (Signed)
[name] did not read my MyChart message.  Would you please call her and let her know?    [name], Your urine culture is negative for infection.  I recommend that you have a CT to see if any of your stones have migrated and our causing your pain.  I have placed the order and they will call you to schedule the scan.  Once you have your appointment for the CT scan, please call the office [phone]) or request an appointment through MyChart to go over the results. Suhaas Agena, PA-C

## 2023-03-05 ENCOUNTER — Other Ambulatory Visit: Payer: Self-pay | Admitting: Gastroenterology

## 2023-03-05 NOTE — Telephone Encounter (Signed)
.  left message to have patient's daughter return my call.  

## 2023-03-07 NOTE — Telephone Encounter (Signed)
[facility] with daugher Carrie Bryant

## 2023-03-08 ENCOUNTER — Ambulatory Visit
Admission: RE | Admit: 2023-03-08 | Discharge: 2023-03-08 | Disposition: A | Discharge status: Home / Self Care | Payer: 59 | Source: Ambulatory Visit | Attending: Urology | Admitting: Urology

## 2023-03-08 DIAGNOSIS — N200 Calculus of kidney: Secondary | ICD-10-CM | POA: Diagnosis present

## 2023-03-11 NOTE — Telephone Encounter (Signed)
Returned call na

## 2023-03-11 NOTE — Telephone Encounter (Signed)
[facility] with Daugher - Camila Li

## 2023-03-14 NOTE — Telephone Encounter (Signed)
Tried calling pt's daughter again, no answer, left VM. I will close out this note due to the amount of times we have called.

## 2023-03-21 NOTE — Telephone Encounter (Signed)
Spoke with daughter and advised results.   Mrs. Carrie Bryant, Your CT scan did not show any stones that would be causing your symptoms.  I recommend to see your PCP, Dr. Ephraim Hamburger for further evaluation of your pain. Michiel Cowboy, PA-C  Written by Harle Battiest, PA-C on 03/18/2023  6:50 PM EDT

## 2023-04-05 ENCOUNTER — Ambulatory Visit
Admission: EM | Admit: 2023-04-05 | Discharge: 2023-04-05 | Disposition: A | Discharge status: Home / Self Care | Payer: 59 | Attending: Emergency Medicine | Admitting: Emergency Medicine

## 2023-04-05 DIAGNOSIS — J4521 Mild intermittent asthma with (acute) exacerbation: Secondary | ICD-10-CM

## 2023-04-05 MED ORDER — PREDNISONE 20 MG PO TABS: 60.0000 mg | ORAL_TABLET | Freq: Every day | ORAL | 0 refills | Status: AC

## 2023-04-05 MED ORDER — DEXAMETHASONE SODIUM PHOSPHATE 10 MG/ML IJ SOLN
10.0000 mg | Freq: Once | INTRAMUSCULAR | Status: AC
  Administered 2023-04-05: 10 mg via INTRAMUSCULAR

## 2023-04-05 NOTE — ED Triage Notes (Signed)
Pt is with her daughter  Pt declines an interpreter  Pt c/o cough, elevated blood pressure x1week  Pt has asthma and has taken OTC medication - Mucinex and nasal sprays

## 2023-04-05 NOTE — ED Provider Notes (Signed)
[facility]    CSN: [id number] Arrival date & time: 04/05/23  1208      History   Chief Complaint Chief Complaint  Patient presents with   Cough    HPI Carrie Bryant is a 72 y.o. female.   HPI  72 year old female with past medical history significant for hypertension, kidney stones, thyroid disease, hypercholesterol, depression, asthma, arthritis, anxiety, and anemia presents for evaluation of a nonproductive cough that has been going on for the past week.  This is associated with some shortness of breath and wheezing but no fever or URI symptoms.  She has been using her inhaler with some mild improvement of her symptoms though she continues to have a cough and now she started to complain of pain in her upper shoulders bilaterally.  She is also concerned because she has had some elevation of her blood pressure readings over the past week though her blood pressure at triage is normal at 117/74.  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, MD;  Location: [facility] ENDOSCOPY;  Service:  Gastroenterology;;   BIOPSY  02/27/2021   Procedure: BIOPSY;  Surgeon: Lemar Lofty., MD;  Location: [facility] ENDOSCOPY;  Service: Gastroenterology;;   COLONOSCOPY WITH PROPOFOL N/A 05/02/2020   Procedure: COLONOSCOPY WITH BIOPSY;  Surgeon: Pasty Spillers, MD;  Location: [facility];  Service: Endoscopy;  Laterality: N/A;  PRIORITY 4   COLONOSCOPY WITH PROPOFOL N/A 07/18/2020   Procedure: COLONOSCOPY WITH PROPOFOL;  Surgeon: Meridee Score Netty Starring., MD;  Location: [facility] ENDOSCOPY;  Service: Gastroenterology;  Laterality: N/A;   COLONOSCOPY WITH PROPOFOL N/A 02/27/2021   Procedure: COLONOSCOPY WITH PROPOFOL;  Surgeon: Meridee Score Netty Starring., MD;  Location: [facility] ENDOSCOPY;  Service: Gastroenterology;  Laterality: N/A;   ENDOSCOPIC MUCOSAL RESECTION N/A 07/18/2020   Procedure: ENDOSCOPIC MUCOSAL RESECTION;  Surgeon: Meridee Score Netty Starring., MD;  Location: [facility] ENDOSCOPY;  Service: Gastroenterology;  Laterality: N/A;   ESOPHAGOGASTRODUODENOSCOPY (EGD) WITH PROPOFOL N/A 07/18/2020   Procedure: ESOPHAGOGASTRODUODENOSCOPY (EGD) WITH PROPOFOL;  Surgeon: Meridee Score Netty Starring., MD;  Location: [facility] ENDOSCOPY;  Service: Gastroenterology;  Laterality: N/A;   ESOPHAGOGASTRODUODENOSCOPY (EGD) WITH PROPOFOL N/A 02/27/2021   Procedure: ESOPHAGOGASTRODUODENOSCOPY (EGD) WITH PROPOFOL;  Surgeon: Meridee Score Netty Starring., MD;  Location: [facility] ENDOSCOPY;  Service: Gastroenterology;  Laterality: N/A;   HEMOSTASIS CLIP PLACEMENT  07/18/2020   Procedure: HEMOSTASIS CLIP PLACEMENT;  Surgeon: Lemar Lofty., MD;  Location: [facility] ENDOSCOPY;  Service: Gastroenterology;;   POLYPECTOMY N/A 05/02/2020   Procedure: POLYPECTOMY;  Surgeon: Pasty Spillers,  MD;  Location: [facility];  Service: Endoscopy;  Laterality: N/A;   POLYPECTOMY  07/18/2020   Procedure: POLYPECTOMY;  Surgeon: Meridee Score Netty Starring., MD;  Location: [facility] ENDOSCOPY;  Service: Gastroenterology;;   POLYPECTOMY  02/27/2021   Procedure:  POLYPECTOMY;  Surgeon: Lemar Lofty., MD;  Location: [facility] ENDOSCOPY;  Service: Gastroenterology;;   SUBMUCOSAL LIFTING INJECTION  07/18/2020   Procedure: SUBMUCOSAL LIFTING INJECTION;  Surgeon: Lemar Lofty., MD;  Location: [facility] ENDOSCOPY;  Service: Gastroenterology;;    OB History   No obstetric history on file.      Home Medications    Prior to Admission medications   Medication Sig Start Date End Date Taking? Authorizing Provider  albuterol (VENTOLIN HFA) 108 (90 Base) MCG/ACT inhaler Inhale 2 puffs into the lungs every 4 (four) hours as needed for wheezing or shortness of breath. 01/07/20  Yes Lamptey, Britta Mccreedy, MD  Cholecalciferol (VITAMIN D-3) 25 MCG (1000 UT) CAPS Take 1,000 Units by mouth daily.   Yes [provider]  clonazePAM (KLONOPIN) 2 MG tablet Take 2 mg by mouth at bedtime.   Yes [provider]  escitalopram (LEXAPRO) 20 MG tablet Take 20 mg by mouth daily. 09/05/21  Yes [provider]  esomeprazole (NEXIUM) 40 MG capsule Take 1 capsule (40 mg) by mouth every day or every other day 03/05/23  Yes Kennedy-Smith, Malachi Carl, NP  estradiol (ESTRACE) 0.1 MG/GM vaginal cream Place 1 Applicatorful vaginally at bedtime. Estrogen Cream Instruction Discard applicator Apply pea sized amount to tip of finger to urethra before bed. Wash hands well after application. Use Monday, Wednesday and Friday 10/17/21  Yes Vanna Scotland, MD  fluticasone [facility]) 50 MCG/ACT nasal spray Place 2 sprays into both nostrils 2 (two) times daily. 02/08/21  Yes [provider]  furosemide (LASIX) 20 MG tablet Take 20 mg by mouth daily. 12/15/19  Yes [provider]  gabapentin (NEURONTIN) 100 MG capsule Take 100 mg by mouth at bedtime. 09/11/21  Yes [provider]  ibuprofen (ADVIL) 400 MG tablet Take 1 tablet (400 mg total) by mouth every 6 (six) hours as needed. 05/08/22  Yes Brimage, Seward Meth, DO  levothyroxine (SYNTHROID) 25 MCG tablet Take 25  mcg by mouth daily before breakfast. 02/21/20  Yes [provider]  montelukast (SINGULAIR) 10 MG tablet Take 10 mg by mouth at bedtime. 12/15/19  Yes [provider]  Multiple Vitamin (MULTIVITAMIN) tablet Take 1 tablet by mouth daily.   Yes [provider]  ondansetron (ZOFRAN-ODT) 4 MG disintegrating tablet Take 1 tablet (4 mg total) by mouth every 8 (eight) hours as needed. 09/12/22  Yes Brimage, Vondra, DO  predniSONE (DELTASONE) 20 MG tablet Take 3 tablets (60 mg total) by mouth daily with breakfast for 5 days. 3 tablets daily for 5 days. 04/05/23 04/10/23 Yes Becky Augusta, NP  simvastatin (ZOCOR) 40 MG tablet SMARTSIG:1 Tablet(s) By Mouth Every Evening 10/16/21  Yes [provider]  tamsulosin (FLOMAX) 0.4 MG CAPS capsule Take 1 capsule (0.4 mg total) by mouth daily. 01/01/21  Yes Becky Augusta, NP  tiZANidine (ZANAFLEX) 4 MG tablet Take 1 tablet (4 mg total) by mouth every 6 (six) hours as needed for muscle spasms. 05/08/22  Yes Brimage, Vondra, DO  traZODone (DESYREL) 150 MG tablet Take 150 mg by mouth at bedtime.   Yes [provider]  valsartan (DIOVAN) 80 MG tablet Take 80 mg by mouth daily. 12/27/19  Yes [provider]  vitamin B-12 (CYANOCOBALAMIN) 1000 MCG tablet Take  1,000 mcg by mouth daily.   Yes [provider]  ferrous sulfate 325 (65 FE) MG tablet Take 325 mg by mouth daily.    [provider]  potassium chloride SA (KLOR-CON M) 20 MEQ tablet Take 2 tablets (40 mEq total) by mouth 2 (two) times daily for 3 doses. 09/12/22 09/14/22  Katha Cabal, DO    Family History Family History  Problem Relation Age of Onset   Colon cancer Neg Hx    Esophageal cancer Neg Hx    Inflammatory bowel disease Neg Hx    Liver disease Neg Hx    Pancreatic cancer Neg Hx    Rectal cancer Neg Hx    Stomach cancer Neg Hx     Social History Social History   Tobacco Use   Smoking status: Never    Passive exposure: Never    Smokeless tobacco: Never  Vaping Use   Vaping status: Never Used  Substance Use Topics   Alcohol use: Never   Drug use: Never     Allergies   Morphine and codeine and Codeine   Review of Systems Review of Systems  Constitutional:  Negative for fever.  HENT:  Negative for congestion, ear pain, rhinorrhea and sore throat.   Respiratory:  Positive for cough, shortness of breath and wheezing.   Musculoskeletal:  Positive for neck pain.     Physical Exam Triage Vital Signs ED Triage Vitals  Encounter Vitals Group     BP      Systolic BP Percentile      Diastolic BP Percentile      Pulse      Resp      Temp      Temp src      SpO2      Weight      Height      Head Circumference      Peak Flow      Pain Score      Pain Loc      Pain Education      Exclude from Growth Chart    No data found.  Updated Vital Signs BP 117/74 (BP Location: Left Arm)   Pulse 62   Temp 98.3 F (36.8 C) (Oral)   Ht 4\' 11"  (1.499 m)   Wt 145 lb (65.8 kg)   SpO2 97%   BMI 29.29 kg/m   Visual Acuity Right Eye Distance:   Left Eye Distance:   Bilateral Distance:    Right Eye Near:   Left Eye Near:    Bilateral Near:     Physical Exam Vitals and nursing note reviewed.  Constitutional:      Appearance: Normal appearance. She is not ill-appearing.  HENT:     Head: Normocephalic and atraumatic.  Cardiovascular:     Rate and Rhythm: Normal rate and regular rhythm.     Pulses: Normal pulses.     Heart sounds: Normal heart sounds. No murmur heard.    No friction rub. No gallop.  Pulmonary:     Effort: Pulmonary effort is normal.     Breath sounds: Wheezing present. No rhonchi or rales.  Skin:    General: Skin is warm and dry.     Capillary Refill: Capillary refill takes less than 2 seconds.  Neurological:     General: No focal deficit present.     Mental Status: She is alert and oriented to person, place, and time.      UC Treatments / Results  Labs (all labs ordered  are listed, but only abnormal results are displayed) Labs Reviewed - No data to display  EKG   Radiology No results found.  Procedures Procedures (including critical care time)  Medications Ordered in UC Medications  dexamethasone (DECADRON) injection 10 mg (has no administration in time range)    Initial Impression / Assessment and Plan / UC Course  I have reviewed the triage vital signs and the nursing notes.  Pertinent labs & imaging results that were available during my care of the patient were reviewed by me and considered in my medical decision making (see chart for details).   Patient is a nontoxic-appearing 72 year old female presenting for evaluation of 1 week worth of respiratory symptoms as outlined HPI above.  On exam she has scattered wheezes in bilateral bases as well as decreased lung sounds diffusely.  She endorses a cough that is nonproductive and she is afebrile.  She is able to speak in full sentence without dyspnea or tachypnea.  She does have a history of asthma and I suspect that this is an asthma exacerbation, especially given she has no URI symptoms.  I we will have her continue her albuterol inhaler at home 1 to 2 puffs every 4-6 hours as needed for shortness breath and wheezing and start her on prednisone 60 mg burst dose starting tomorrow.  I will administer 10 mg of IM Decadron here in clinic to help with her pulmonary inflammation.  I will also prescribe Tessalon Perles and Promethazine DM cough syrup.  If her symptoms not improve she should return for reevaluation.   Final Clinical Impressions(s) / UC Diagnoses   Final diagnoses:  Mild intermittent asthma with exacerbation     Discharge Instructions      Utilice el inhalador de albuterol con un espaciador, 1 a 2 inhalaciones cada 4 a 6 horas, segn sea necesario para la llenura pulmonar filial, sibilancia o falta de aire.  Tome la prednisona 60 mg diariamente con alimentos durante los prximos 5  das. Esto disminuir la inflamacin pulmonar y, con suerte, ayudar con sus sntomas. Tambin debera aliviar su fatiga.  Si sus sntomas continan o empeoran, regrese para una reevaluacin o consulte a su mdico de atencin primaria.  Use the albuterol inhaler with a spacer, 1 to 2 puffs every 4-6 hours, as needed for filial lung fullness, wheezing, or shortness of breath.  Take the prednisone 60 mg daily with food for the next 5 days.  This will decrease lung inflammation and hopefully help your symptoms.  It should also up with your fatigue.  If your symptoms continue, or they worsen, please return for reevaluation or see your primary care provider.      ED Prescriptions     Medication Sig Dispense Auth. Provider   predniSONE (DELTASONE) 20 MG tablet Take 3 tablets (60 mg total) by mouth daily with breakfast for 5 days. 3 tablets daily for 5 days. 15 tablet Becky Augusta, NP      PDMP not reviewed this encounter.   Becky Augusta, NP 04/05/23 1249

## 2023-04-05 NOTE — Discharge Instructions (Addendum)
Utilice el inhalador de albuterol con Architect, 1 a 2 inhalaciones cada 4 a 6 horas, segn sea necesario para la llenura pulmonar filial, sibilancia o falta de aire.  Tome la prednisona 60 mg diariamente con alimentos durante los prximos 5 das. Esto disminuir la inflamacin pulmonar y, con suerte, ayudar con sus sntomas. Tambin debera aliviar su fatiga.  Si sus sntomas continan o empeoran, regrese para una reevaluacin o consulte a su mdico de atencin primaria.  Use the albuterol inhaler with a spacer, 1 to 2 puffs every 4-6 hours, as needed for filial lung fullness, wheezing, or shortness of breath.  Take the prednisone 60 mg daily with food for the next 5 days.  This will decrease lung inflammation and hopefully help your symptoms.  It should also up with your fatigue.  If your symptoms continue, or they worsen, please return for reevaluation or see your primary care provider.

## 2023-05-08 ENCOUNTER — Encounter: Payer: Self-pay | Admitting: Gastroenterology

## 2023-05-08 ENCOUNTER — Ambulatory Visit (INDEPENDENT_AMBULATORY_CARE_PROVIDER_SITE_OTHER): Payer: 59 | Admitting: Gastroenterology

## 2023-05-08 VITALS — BP 96/64 | HR 72 | Ht <= 58 in | Wt 144.5 lb

## 2023-05-08 DIAGNOSIS — Z860100 Personal history of colon polyps, unspecified: Secondary | ICD-10-CM

## 2023-05-08 DIAGNOSIS — K5909 Other constipation: Secondary | ICD-10-CM

## 2023-05-08 DIAGNOSIS — A480 Gas gangrene: Secondary | ICD-10-CM

## 2023-05-08 DIAGNOSIS — K5900 Constipation, unspecified: Secondary | ICD-10-CM

## 2023-05-08 DIAGNOSIS — R141 Gas pain: Secondary | ICD-10-CM | POA: Diagnosis not present

## 2023-05-08 DIAGNOSIS — Z860101 Personal history of adenomatous and serrated colon polyps: Secondary | ICD-10-CM

## 2023-05-08 DIAGNOSIS — K2940 Chronic atrophic gastritis without bleeding: Secondary | ICD-10-CM

## 2023-05-08 DIAGNOSIS — R142 Eructation: Secondary | ICD-10-CM | POA: Diagnosis not present

## 2023-05-08 DIAGNOSIS — R140 Abdominal distension (gaseous): Secondary | ICD-10-CM | POA: Diagnosis not present

## 2023-05-08 DIAGNOSIS — K31A0 Gastric intestinal metaplasia, unspecified: Secondary | ICD-10-CM

## 2023-05-08 MED ORDER — ESOMEPRAZOLE MAGNESIUM 40 MG PO CPDR: 40.0000 mg | DELAYED_RELEASE_CAPSULE | Freq: Two times a day (BID) | ORAL | 2 refills | Status: DC

## 2023-05-08 NOTE — Progress Notes (Signed)
GASTROENTEROLOGY OUTPATIENT CLINIC VISIT   Primary Care Provider Revelo, Presley Raddle, MD [address] [place] [address] [phone]   Patient Profile: Carrie Bryant is a 72 y.o. female with a pmh significant for hypertension, hyperlipidemia, asthma, nephrolithiasis, status post hysterectomy, colon polyps (TAs & TVAs), autoimmune gastritis, GIM.  The patient presents to the [facility] for an evaluation and management of problem(s) noted below:  Problem List 1. Abdominal gas pain   2. Eructation   3. Bloating   4. Other constipation   5. Hx of adenomatous colonic polyps   6. Gastric intestinal metaplasia   7. Atrophic gastritis without hemorrhage    Discussed the use of AI scribe software for clinical note transcription with the patient, who gave verbal consent to proceed.  History of Present Illness Please see prior consultation note/clinic notes for full details of HPI.  Interval History The patient presents for follow-up.  She has previously been followed by [facility] (Dr. Maximino Greenland) for her primary GI issues, but as that provider has left, she wants to transition her care her.  I had only taken care of her endoscopic management of her difficult colonic polyps previously.  However, today, with her daughter and with interpreter services we have begun to try and better understand her longer standing issues.  She seems frustrated by the lack of movement in her longer standing "gastritis" pain and "heartburn" symptoms and "significant constipation" symptoms.  She has pyrosis symptoms and has been on PPI (though not clear if this has been Protonix vs Nexium - patient believes Nexium more likely but could be both).  She also experiences significant eructation and flatus with bloating and discomfort.  Constipation is treated with OTC stool softeners but still leads to straining and discomfort in her back as a result of this.  She has not been on more  powerful laxatives per report.  She knows that she will be due for polyp surveillance next year and wonders about other preparation available to help because it significant wipes her out for the next few days with lots of nausea and vomiting occurring as well.  The patient's weight has remained stable throughout this period, and she reports no significant changes in diet or lifestyle. Despite the ongoing discomfort and the lack of improvement with current medications, the patient has not sought additional medical attention with GI for almost 2-years.  She will be going to [place] for the months of December and January and returning in February.  GI Review of Systems Positive as above Negative for odynophagia, dysphagia, nausea currently, melena, hematochezia   Review of Systems General: Denies fevers/chills/weight loss unintentionally Cardiovascular: Denies chest pain Pulmonary: Denies shortness of breath Gastroenterological: See HPI Genitourinary: Denies darkened urine Hematological: Denies easy bruising/bleeding Dermatological: Denies jaundice Psychological: Mood is stable but with all of her symptoms she became quite frustrated and upset she still has symptoms   Medications Current Outpatient Medications  Medication Sig Dispense Refill   albuterol (VENTOLIN HFA) 108 (90 Base) MCG/ACT inhaler Inhale 2 puffs into the lungs every 4 (four) hours as needed for wheezing or shortness of breath. 18 g 0   alendronate (FOSAMAX) 70 MG tablet Take 70 mg by mouth once a week.     amLODipine (NORVASC) 5 MG tablet Take 5 mg by mouth daily.     celecoxib (CELEBREX) 100 MG capsule Take 100 mg by mouth 2 (two) times daily.     Cholecalciferol (VITAMIN D-3) 25 MCG (  1000 UT) CAPS Take 1,000 Units by mouth daily.     clonazePAM (KLONOPIN) 2 MG tablet Take 2 mg by mouth at bedtime.     cyclobenzaprine (FLEXERIL) 5 MG tablet Take 5 mg by mouth 3 (three) times daily as needed.     escitalopram (LEXAPRO) 20  MG tablet Take 20 mg by mouth daily.     esomeprazole (NEXIUM) 40 MG capsule Take 1 capsule (40 mg) by mouth every day or every other day 90 capsule 1   esomeprazole (NEXIUM) 40 MG capsule Take 1 capsule (40 mg total) by mouth 2 (two) times daily before a meal. 60 capsule 2   estradiol (ESTRACE) 0.1 MG/GM vaginal cream Place 1 Applicatorful vaginally at bedtime. Estrogen Cream Instruction Discard applicator Apply pea sized amount to tip of finger to urethra before bed. Wash hands well after application. Use Monday, Wednesday and Friday 42.5 g 2   ferrous sulfate 325 (65 FE) MG tablet Take 325 mg by mouth every other day.     fluticasone (FLONASE) 50 MCG/ACT nasal spray Place 2 sprays into both nostrils 2 (two) times daily.     furosemide (LASIX) 20 MG tablet Take 20 mg by mouth daily.     gabapentin (NEURONTIN) 100 MG capsule Take 100 mg by mouth at bedtime.     ibuprofen (ADVIL) 400 MG tablet Take 1 tablet (400 mg total) by mouth every 6 (six) hours as needed. 30 tablet 0   levothyroxine (SYNTHROID) 25 MCG tablet Take 25 mcg by mouth daily before breakfast.     montelukast (SINGULAIR) 10 MG tablet Take 10 mg by mouth at bedtime.     Multiple Vitamin (MULTIVITAMIN) tablet Take 1 tablet by mouth daily.     ondansetron (ZOFRAN-ODT) 4 MG disintegrating tablet Take 1 tablet (4 mg total) by mouth every 8 (eight) hours as needed. 20 tablet 0   potassium chloride SA (KLOR-CON M) 20 MEQ tablet Take 2 tablets (40 mEq total) by mouth 2 (two) times daily for 3 doses. 6 tablet 0   rosuvastatin (CRESTOR) 20 MG tablet Take 20 mg by mouth daily.     tamsulosin (FLOMAX) 0.4 MG CAPS capsule Take 1 capsule (0.4 mg total) by mouth daily. 30 capsule 0   tiZANidine (ZANAFLEX) 2 MG tablet Take 2 mg by mouth 2 (two) times daily as needed.     traZODone (DESYREL) 150 MG tablet Take 150 mg by mouth at bedtime.     valsartan (DIOVAN) 80 MG tablet Take 80 mg by mouth daily.     vitamin B-12 (CYANOCOBALAMIN) 1000 MCG tablet  Take 1,000 mcg by mouth daily.     simvastatin (ZOCOR) 40 MG tablet SMARTSIG:1 Tablet(s) By Mouth Every Evening (Patient not taking: Reported on 05/08/2023)     tiZANidine (ZANAFLEX) 4 MG tablet Take 1 tablet (4 mg total) by mouth every 6 (six) hours as needed for muscle spasms. (Patient not taking: Reported on 05/08/2023) 30 tablet 0   No current facility-administered medications for this visit.    Allergies Allergies  Allergen Reactions   Morphine And Codeine Anaphylaxis and Other (See Comments)    dizziness   Codeine Anxiety and Other (See Comments)    Hypertension (elevates BP) & chest pain.    Histories Past Medical History:  Diagnosis Date   Anemia    Anxiety    Arthritis    Asthma    Depression    History of kidney stones    Hypercholesterolemia    Hypertension  Kidney stones    Thyroid disease    Past Surgical History:  Procedure Laterality Date   ABDOMINAL HYSTERECTOMY     BIOPSY  07/18/2020   Procedure: BIOPSY;  Surgeon: Meridee Score Netty Starring., MD;  Location: [facility] ENDOSCOPY;  Service: Gastroenterology;;   BIOPSY  02/27/2021   Procedure: BIOPSY;  Surgeon: Lemar Lofty., MD;  Location: [facility] ENDOSCOPY;  Service: Gastroenterology;;   COLONOSCOPY WITH PROPOFOL N/A 05/02/2020   Procedure: COLONOSCOPY WITH BIOPSY;  Surgeon: Pasty Spillers, MD;  Location: [facility]ERY [facility];  Service: Endoscopy;  Laterality: N/A;  PRIORITY 4   COLONOSCOPY WITH PROPOFOL N/A 07/18/2020   Procedure: COLONOSCOPY WITH PROPOFOL;  Surgeon: Meridee Score Netty Starring., MD;  Location: [facility] ENDOSCOPY;  Service: Gastroenterology;  Laterality: N/A;   COLONOSCOPY WITH PROPOFOL N/A 02/27/2021   Procedure: COLONOSCOPY WITH PROPOFOL;  Surgeon: Meridee Score Netty Starring., MD;  Location: [facility] ENDOSCOPY;  Service: Gastroenterology;  Laterality: N/A;   ENDOSCOPIC MUCOSAL RESECTION N/A 07/18/2020   Procedure: ENDOSCOPIC MUCOSAL RESECTION;  Surgeon: Meridee Score Netty Starring., MD;  Location: [facility] ENDOSCOPY;  Service:  Gastroenterology;  Laterality: N/A;   ESOPHAGOGASTRODUODENOSCOPY (EGD) WITH PROPOFOL N/A 07/18/2020   Procedure: ESOPHAGOGASTRODUODENOSCOPY (EGD) WITH PROPOFOL;  Surgeon: Meridee Score Netty Starring., MD;  Location: [facility] ENDOSCOPY;  Service: Gastroenterology;  Laterality: N/A;   ESOPHAGOGASTRODUODENOSCOPY (EGD) WITH PROPOFOL N/A 02/27/2021   Procedure: ESOPHAGOGASTRODUODENOSCOPY (EGD) WITH PROPOFOL;  Surgeon: Meridee Score Netty Starring., MD;  Location: [facility] ENDOSCOPY;  Service: Gastroenterology;  Laterality: N/A;   HEMOSTASIS CLIP PLACEMENT  07/18/2020   Procedure: HEMOSTASIS CLIP PLACEMENT;  Surgeon: Lemar Lofty., MD;  Location: [facility] ENDOSCOPY;  Service: Gastroenterology;;   POLYPECTOMY N/A 05/02/2020   Procedure: POLYPECTOMY;  Surgeon: Pasty Spillers, MD;  Location: [facility] SURGERY CNTR;  Service: Endoscopy;  Laterality: N/A;   POLYPECTOMY  07/18/2020   Procedure: POLYPECTOMY;  Surgeon: Meridee Score Netty Starring., MD;  Location: [facility] ENDOSCOPY;  Service: Gastroenterology;;   POLYPECTOMY  02/27/2021   Procedure: POLYPECTOMY;  Surgeon: Lemar Lofty., MD;  Location: [facility] ENDOSCOPY;  Service: Gastroenterology;;   SUBMUCOSAL LIFTING INJECTION  07/18/2020   Procedure: SUBMUCOSAL LIFTING INJECTION;  Surgeon: Lemar Lofty., MD;  Location: [facility] ENDOSCOPY;  Service: Gastroenterology;;   Social History   Socioeconomic History   Marital status: Married    Spouse name: Not on file   Number of children: Not on file   Years of education: Not on file   Highest education level: Not on file  Occupational History   Not on file  Tobacco Use   Smoking status: Never    Passive exposure: Never   Smokeless tobacco: Never  Vaping Use   Vaping status: Never Used  Substance and Sexual Activity   Alcohol use: Never   Drug use: Never   Sexual activity: Not on file  Other Topics Concern   Not on file  Social History Narrative   Not on file   Social Determinants of Health   Financial Resource  Strain: Not on file  Food Insecurity: Not on file  Transportation Needs: Not on file  Physical Activity: Not on file  Stress: Not on file  Social Connections: Not on file  Intimate Partner Violence: Not on file   Family History  Problem Relation Age of Onset   Colon cancer Neg Hx    Esophageal cancer Neg Hx    Inflammatory bowel disease Neg Hx    Liver disease Neg Hx    Pancreatic cancer Neg Hx    Rectal cancer Neg Hx    Stomach  cancer Neg Hx    I have reviewed her medical, social, and family history in detail and updated the electronic medical record as necessary.    PHYSICAL EXAMINATION  BP 96/64 (BP Location: Left Arm, Patient Position: Sitting, Cuff Size: Normal)   Pulse 72   Ht 4' 8.25" (1.429 m) Comment: height measured without shoes  Wt 144 lb 8 oz (65.5 kg)   BMI 32.11 kg/m  Wt Readings from Last 3 Encounters:  05/08/23 144 lb 8 oz (65.5 kg)  04/05/23 145 lb (65.8 kg)  02/08/23 147 lb (66.7 kg)  GEN: NAD, appears stated age, doesn't appear chronically ill, accompanied by daughter PSYCH: Cooperative, without pressured speech EYE: Conjunctivae pink, sclerae anicteric ENT: MMM CV: Nontachycardic RESP: No audible wheezing GI: NABS, soft, NT/ND, without rebound or guarding, MSK/EXT: No significant lower extremity edema SKIN: No jaundice NEURO:  Alert & Oriented x 3, no focal deficits   REVIEW OF DATA  I reviewed the following data at the time of this encounter:  GI Procedures and Studies  August 2022 colonoscopy - Hemorrhoids found on digital rectal exam. - The examined portion of the ileum was normal. - Four, 2 to 4 mm polyps in the rectum, in the descending colon, in the transverse colon and in the cecum, removed with a cold snare. Resected and retrieved. - Diverticulosis in the recto-sigmoid colon and in the sigmoid colon. - Post mucosectomy scar in the mid rectum. Biopsied. - Normal mucosa in the entire examined colon otherwise. - Non-bleeding non-thrombosed  external and internal hemorrhoids.  August 2022 EGD - No gross lesions in esophagus proximally. Salmon-colored mucosa island suggestive of Barrett's esophagus distally - biopsied. - Z-line irregular, 34 cm from the incisors. - 3 cm hiatal hernia. - Erythematous mucosa in the stomach. Gastric Mapping was performed due to history of Intestinal Metaplasia - biopsied. - No gross lesions in the duodenal bulb, in the first portion of the duodenum and in the second portion of the duodenum.  Pathology FINAL MICROSCOPIC DIAGNOSIS: A. STOMACH, ANTRUM, BIOPSY: -  Reactive gastropathy with mild chronic inflammation -  No H. pylori or intestinal metaplasia identified B. STOMACH, INCISURA, BIOPSY: -  Reactive gastropathy with mild chronic inflammation -  No H. pylori or intestinal metaplasia identified C. STOMACH, LESSER CURVE, BIOPSY: -  Chronic gastritis with intestinal metaplasia and micronodular ECL cell hyperplasia, consistent with chronic atrophic autoimmune gastritis -  See comment D. STOMACH, GREATER CURVE, BIOPSY: -  Chronic gastritis with intestinal metaplasia and micronodular ECL cell hyperplasia, consistent with chronic atrophic autoimmune gastritis -  See comment E. STOMACH, FUNDUS AND CARDIA, BIOPSY: -  Chronic gastritis with intestinal metaplasia and micronodular ECL cell hyperplasia, consistent with chronic atrophic autoimmune gastritis -  See comment F. ESOPHAGUS, DISTAL, BIOPSY: -  Squamocolumnar junction with chronic inflammation -  No intestinal metaplasia, dysplasia or malignancy identified G. COLON, CECUM, TRANSVERSE, DESCENDING, RECTUM, POLYPECTOMY: -  Multiple fragments of hyperplastic polyp(s) and benign colonic mucosa with lymphoid aggregate(s) -  No high-grade dysplasia or malignancy identified H. RECTUM, SCAR, BIOPSY: -  Multiple fragments of benign colonic mucosa with hyperplastic changes -  No high grade dysplasia or malignancy identified   Laboratory Studies   Reviewed those in epic  Imaging Studies  March 2024 CT abdomen pelvis IMPRESSION: 1. No acute abnormality in the abdomen or pelvis. 2. Bilateral renal cysts. The cysts do not require dedicated follow-up. Evidence for renal cortical scarring particularly in the left kidney. 3. 2 mm right solid pulmonary nodule.  Per Fleischner Society Guidelines, no routine follow-up imaging is recommended. These guidelines do not apply to immunocompromised patients and patients with cancer. Follow up in patients with significant comorbidities as clinically warranted. For lung cancer screening, adhere to Lung-RADS guidelines. Reference: Radiology. 2017; 284(1):228-43. 4. Aortic Atherosclerosis (ICD10-I70.0). ADDENDUM: Pre contrast images of the upper abdomen were inadvertently obtained for this examination. These images were interpreted. Small calcifications scattered throughout the right kidney could represent nonobstructive right renal calculi. High-density material layering in the left kidney lower pole associated with a fluid containing structure. There is a small amount of contrast in this fluid structure on the delayed renal images and suspect this is related to a left renal calyceal diverticulum on image 44/2.  September 2024 CT renal stone study IMPRESSION: Tiny bilateral renal calculi. No evidence of ureteral calculi, hydronephrosis, or other acute findings. Tiny uterine fibroids.   ASSESSMENT  Ms. Asharie Srivastava is a 72 y.o. female with a pmh significant for hypertension, hyperlipidemia, asthma, nephrolithiasis, status post hysterectomy, colon polyps (TAs & TVAs), autoimmune gastritis, GIM.  The patient is seen today for evaluation and management of:  1. Abdominal gas pain   2. Eructation   3. Bloating   4. Other constipation   5. Hx of adenomatous colonic polyps   6. Gastric intestinal metaplasia   7. Atrophic gastritis without hemorrhage    The patient is hemodynamically stable.   Clinically, she has a multitude of GI symptoms for which have not been adequately discerned by her prior gastroenterologist.  Will work with her over the course of the coming months to see what we can try to do.  Regards to her belching and regurgitation symptoms were going to make sure she is on appropriate PPI dosing and not taking 2 separate PPIs.  Thus I agree that we will stay on Nexium 40 mg twice daily.  He will also need to consider the role of gastric emptying evaluation.  For her bloating symptom we will consider SIBO breath testing while optimizing her constipation treatment.  Will going to give her samples and see if Linzess is helpful for her if it is then we can give a formal prescription versus trying another sample.  For now she can continue taking the oral stool softener that she is taking at home.  She is due for colonoscopy for surveillance and we will plan to do that in 2025.  With her history of gastric intestinal metaplasia and upper GI symptoms should be due for gastric mapping as well plan to do that at the same time as her colonoscopy.  Will try to work with her again to evaluate her longstanding symptoms over the last few years that have not been adequately treated by prior gastroenterologist.  All patient questions were answered to the best of my ability, and the patient agrees to the aforementioned plan of action with follow-up as indicated.   PLAN  Nexium 40 mg twice daily - Take before meals 15 to 30 minutes Surveillance endoscopy for atrophic gastritis/GIM to be performed in 2025 Surveillance colonoscopy to be performed in 2025 Linzess samples given to patient to see if helpful - If helpful then we will send in prescription versus trying other samples - Patient/daughter will need to let us know SIBO breath testing considered but deferred by patient   No orders of the defined types were placed in this encounter.    New Prescriptions   ESOMEPRAZOLE (NEXIUM) 40 MG  CAPSULE    Take 1 capsule (40  mg total) by mouth 2 (two) times daily before a meal.   Modified Medications   No medications on file    Planned Follow Up No follow-ups on file.   Total Time in Face-to-Face and in Coordination of Care for patient including independent/personal interpretation/review of prior testing, medical history, examination, medication adjustment, communicating results with the patient directly, and documentation with the EHR is 40 minutes.  Corliss Parish, MD [facility] Office # [phone]

## 2023-05-08 NOTE — Patient Instructions (Addendum)
Deje de tomar pantoprazol.  Comience a tomar Nexium 40 mg: tome 1 cpsula por va oral dos veces al da.  Compre los siguientes medicamentos sin receta y tmelos segn las indicaciones: Gas-X extra strength O Phazyme 500: una vez al [employer].  Le hemos proporcionado muestras de los siguientes medicamentos para que los tome: Linzess 145 mcg: tome 1 pastilla una vez al da.  Se le ha programado una visita previa para el 18/2/25 a las 10:00 a. m. Regstrese en el segundo piso con la enfermera de la visita previa.  La colonoscopia se realizar el 21/2/25 a las 11:00 a. m. Se le darn todas las instrucciones en la cita previa a la visita.  _______________________________________________________  Arville Lime presin arterial en la visita era de 140/90 o ms, comunquese con su mdico de atencin primaria para hacer un seguimiento.  ______________________________________________________  Si tiene 65 aos o ms, su ndice de masa corporal debe estar entre 23 y 30. Su ndice de masa corporal es de 32,11 kg/m. Si est fuera del rango mencionado anteriormente, considere hacer un seguimiento con su proveedor de Marine scientist.  Si tiene 64 aos o menos, su ndice de [employer] corporal debe estar entre 19 y 56. Su ndice de masa corporal es de 32,11 kg/m. Si est fuera del rango mencionado anteriormente, considere hacer un seguimiento con su proveedor de Marine scientist.  ________________________________________________________  [employer] de [facility] desean alentarlo a que use MYCHART para comunicarse con los proveedores para solicitudes o preguntas que no sean urgentes. Debido a los Retail buyer de espera en el telfono, enviarle un mensaje a su proveedor por [employer] puede ser una forma ms rpida y eficiente de obtener una respuesta. Espere 48 horas hbiles para recibir [employer]. Recuerde que esto es para solicitudes que no sean  urgentes. _______________________________________________________  Debido a los cambios recientes en las [address] de atencin mdica, es posible que vea los resultados de sus estudios de diagnstico por imgenes y de laboratorio en MyChart antes de que su proveedor haya tenido la oportunidad de revisarlos. Entendemos que, en algunos casos, puede haber [employer] lo confundan o lo preocupen. No todos los resultados de laboratorio llegan en el mismo perodo de tiempo y el proveedor puede estar esperando varios resultados para Administrator, Civil Service. Por favor, denos 48 horas para que su proveedor revise minuciosamente todos los resultados antes de comunicarse con el consultorio para [place] una aclaracin sobre sus [place].  Gracias por elegirme a m y a Adult nurse Gastroenterology _________________________________________________________________________  Stop Pantoprazole.   Start Nexium 40 mg - Take 1 capsule by mouth twice daily.   Please purchase the following medications over the counter and take as directed: Gas-X extra strength OR Phazyme 500 - once daily.   We have given you samples of the following medication to take: Linzess 145 mcg- Take 1 pill once daily.   You have been scheduled for Pre-visit on : 08/20/23 at 10:00 am. Check in on 2nd floor with pre-visit nurse.   Colonoscopy will be 08/23/23 at 11:00 am. You will be given all instructions at Pre-Visit appointment.   _______________________________________________________  If your blood pressure at your visit was 140/90 or greater, please contact your primary care physician to follow up on this.  _______________________________________________________  If you are age 72 or older, your body mass index should be between 23-30. Your Body mass index is 32.11 kg/m. If this is out of the aforementioned range listed, please consider follow up with your Primary Care Provider.  If you are age 62 or younger, your body mass index should be  between 19-25. Your Body mass index is 32.11 kg/m. If this is out of the aformentioned range listed, please consider follow up with your Primary Care Provider.   ________________________________________________________  The [facility] providers would like to encourage you to use [facility] to communicate with providers for non-urgent requests or questions.  Due to long hold times on the telephone, sending your provider a message by [facility] may be a faster and more efficient way to get a response.  Please allow 48 business hours for a response.  Please remember that this is for non-urgent requests.  _______________________________________________________  Due to recent changes in healthcare laws, you may see the results of your imaging and laboratory studies on MyChart before your provider has had a chance to review them.  We understand that in some cases there may be results that are confusing or concerning to you. Not all laboratory results come back in the same time frame and the provider may be waiting for multiple results in order to interpret others.  Please give [place] 48 hours in order for your provider to thoroughly review all the results before contacting the office for clarification of your results.   Thank you for choosing me and  Gastroenterology.  Dr. Meridee Score

## 2023-05-11 DIAGNOSIS — K31A0 Gastric intestinal metaplasia, unspecified: Secondary | ICD-10-CM | POA: Insufficient documentation

## 2023-05-11 DIAGNOSIS — Z860101 Personal history of adenomatous and serrated colon polyps: Secondary | ICD-10-CM | POA: Insufficient documentation

## 2023-05-11 DIAGNOSIS — K2940 Chronic atrophic gastritis without bleeding: Secondary | ICD-10-CM | POA: Insufficient documentation

## 2023-05-11 DIAGNOSIS — R140 Abdominal distension (gaseous): Secondary | ICD-10-CM | POA: Insufficient documentation

## 2023-05-11 DIAGNOSIS — K5909 Other constipation: Secondary | ICD-10-CM | POA: Insufficient documentation

## 2023-05-11 DIAGNOSIS — R142 Eructation: Secondary | ICD-10-CM | POA: Insufficient documentation

## 2023-05-11 DIAGNOSIS — R141 Gas pain: Secondary | ICD-10-CM | POA: Insufficient documentation

## 2023-08-07 ENCOUNTER — Other Ambulatory Visit: Payer: Self-pay | Admitting: Gastroenterology

## 2023-08-20 ENCOUNTER — Ambulatory Visit (AMBULATORY_SURGERY_CENTER): Payer: 59

## 2023-08-20 VITALS — Ht <= 58 in | Wt 145.8 lb

## 2023-08-20 DIAGNOSIS — K31A0 Gastric intestinal metaplasia, unspecified: Secondary | ICD-10-CM

## 2023-08-20 DIAGNOSIS — R140 Abdominal distension (gaseous): Secondary | ICD-10-CM

## 2023-08-20 DIAGNOSIS — K2940 Chronic atrophic gastritis without bleeding: Secondary | ICD-10-CM

## 2023-08-20 DIAGNOSIS — Z860101 Personal history of adenomatous and serrated colon polyps: Secondary | ICD-10-CM

## 2023-08-20 DIAGNOSIS — K5909 Other constipation: Secondary | ICD-10-CM

## 2023-08-20 MED ORDER — NA SULFATE-K SULFATE-MG SULF 17.5-3.13-1.6 GM/177ML PO SOLN: 1.0000 | Freq: Once | ORAL | 0 refills | Status: AC

## 2023-08-20 MED ORDER — ONDANSETRON HCL 4 MG PO TABS: 4.0000 mg | ORAL_TABLET | ORAL | 0 refills | Status: DC

## 2023-08-20 NOTE — Progress Notes (Signed)
 No egg or soy allergy known to patient  No issues known to pt with past sedation with any surgeries or procedures Patient denies ever being told they had issues or difficulty with intubation  No FH of Malignant Hyperthermia Pt is not on diet pills Pt is not on  home 02  Pt is not on blood thinners  Pt denies issues with constipation  No A fib or A flutter Have any cardiac testing pending-- no  LOA: independent  Prep: suprep   Patient's chart reviewed by Cathlyn Parsons CNRA prior to previsit and patient appropriate for the [facility].  Previsit completed and red dot placed by patient's name on their procedure day (on provider's schedule).     PV completed with patient her daughter. Prep instructions sent via mychart and home address.

## 2023-08-23 ENCOUNTER — Encounter: Payer: Self-pay | Admitting: Gastroenterology

## 2023-08-23 ENCOUNTER — Ambulatory Visit: Payer: 59 | Admitting: Gastroenterology

## 2023-08-23 VITALS — BP 114/71 | HR 71 | Temp 98.0°F | Resp 11 | Ht <= 58 in | Wt 145.0 lb

## 2023-08-23 DIAGNOSIS — Z860101 Personal history of adenomatous and serrated colon polyps: Secondary | ICD-10-CM

## 2023-08-23 DIAGNOSIS — K2940 Chronic atrophic gastritis without bleeding: Secondary | ICD-10-CM

## 2023-08-23 DIAGNOSIS — K31A15 Gastric intestinal metaplasia without dysplasia, involving multiple sites: Secondary | ICD-10-CM | POA: Diagnosis not present

## 2023-08-23 DIAGNOSIS — Z1211 Encounter for screening for malignant neoplasm of colon: Secondary | ICD-10-CM

## 2023-08-23 DIAGNOSIS — K31A0 Gastric intestinal metaplasia, unspecified: Secondary | ICD-10-CM

## 2023-08-23 DIAGNOSIS — K449 Diaphragmatic hernia without obstruction or gangrene: Secondary | ICD-10-CM

## 2023-08-23 DIAGNOSIS — K2970 Gastritis, unspecified, without bleeding: Secondary | ICD-10-CM

## 2023-08-23 DIAGNOSIS — K635 Polyp of colon: Secondary | ICD-10-CM

## 2023-08-23 DIAGNOSIS — K2289 Other specified disease of esophagus: Secondary | ICD-10-CM | POA: Diagnosis not present

## 2023-08-23 DIAGNOSIS — K3189 Other diseases of stomach and duodenum: Secondary | ICD-10-CM | POA: Diagnosis not present

## 2023-08-23 DIAGNOSIS — K641 Second degree hemorrhoids: Secondary | ICD-10-CM | POA: Diagnosis not present

## 2023-08-23 DIAGNOSIS — D123 Benign neoplasm of transverse colon: Secondary | ICD-10-CM | POA: Diagnosis not present

## 2023-08-23 DIAGNOSIS — K229 Disease of esophagus, unspecified: Secondary | ICD-10-CM

## 2023-08-23 DIAGNOSIS — D127 Benign neoplasm of rectosigmoid junction: Secondary | ICD-10-CM

## 2023-08-23 MED ORDER — SODIUM CHLORIDE 0.9 % IV SOLN
500.0000 mL | Freq: Once | INTRAVENOUS | Status: DC
Start: 2023-08-23 — End: 2023-08-23

## 2023-08-23 MED ORDER — LINACLOTIDE 72 MCG PO CAPS: 72.0000 ug | ORAL_CAPSULE | Freq: Every day | ORAL | 6 refills | Status: AC

## 2023-08-23 NOTE — Op Note (Signed)
 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 The patient's tolerance of                            previous anesthesia was also reviewed. The risks                            and benefits of the procedure and the sedation                            options and risks were discussed with the patient.                            All questions were answered, and informed consent                            was obtained. Prior Anticoagulants: The patient has                            taken no anticoagulant or antiplatelet agents                            except for NSAID medication. ASA Grade Assessment:                            II - A patient with mild systemic disease. After                            reviewing the risks and benefits, the patient was  deemed in satisfactory condition to undergo the                            procedure.                           After obtaining informed consent, the colonoscope                            was passed under direct vision. Throughout the                            procedure, the patient's blood pressure, pulse, and                            oxygen saturations were monitored  continuously. The                            CF [id number] #[id number] was introduced through the anus                            and advanced to the 3 cm into the ileum. The                            colonoscopy was performed without difficulty. The                            patient tolerated the procedure. The quality of the                            bowel preparation was good. The terminal ileum,                            ileocecal valve, appendiceal orifice, and rectum                            were photographed. Scope In: 10:57:00 AM Scope Out: 11:07:09 AM Scope Withdrawal Time: 0 hours 8 minutes 31 seconds  Total Procedure Duration: 0 hours 10 minutes 9 seconds  Findings:                 The digital rectal exam findings include                            hemorrhoids. Pertinent negatives include no                            palpable rectal lesions.                           The terminal ileum and ileocecal valve appeared                            normal.  Three sessile polyps were found in the                            recto-sigmoid colon and transverse colon. The                            polyps were 2 to 4 mm in size. These polyps were                            removed with a cold snare. Resection and retrieval                            were complete.                           Normal mucosa was found in the entire colon.                           Non-bleeding non-thrombosed external and internal                            hemorrhoids were found during retroflexion, during                            perianal exam and during digital exam. The                            hemorrhoids were Grade II (internal hemorrhoids                            that prolapse but reduce spontaneously). Complications:            No immediate complications. Estimated Blood Loss:     Estimated blood loss was minimal. Impression:               - Hemorrhoids found on digital  rectal exam.                           - The examined portion of the ileum was normal.                           - Three 2 to 4 mm polyps at the recto-sigmoid colon                            and in the transverse colon, removed with a cold                            snare. Resected and retrieved.                           - Normal mucosa in the entire examined colon.                           - Non-bleeding non-thrombosed external and internal  hemorrhoids. Recommendation:           - The patient will be observed post-procedure,                            until all discharge criteria are met.                           - Discharge patient to home.                           - Patient has a contact number available for                            emergencies. The signs and symptoms of potential                            delayed complications were discussed with the                            patient. Return to normal activities tomorrow.                            Written discharge instructions were provided to the                            patient.                           - High fiber diet.                           - Use FiberCon 1-2 tablets PO daily.                           - Patient states that Linzess samples were helpful                            for her and she would like a prescription. Linzess                            145 mcg daily (30/6) should be prescribed to                            patient.                           - Continue present medications.                           - Await pathology results.                           - Repeat colonoscopy in 3 - 5 years for                            surveillance based on  pathology results. Due to                            patient's history of CVA with HGD, she should never                            go more than 5 years between procedures.                           - The findings and  recommendations were discussed                            with the patient.                           - The findings and recommendations were discussed                            with the patient's family. Corliss Parish, MD 08/23/2023 11:22:18 AM

## 2023-08-23 NOTE — Progress Notes (Signed)
 GASTROENTEROLOGY PROCEDURE H&P NOTE   Primary Care Physician: Preston Fleeting, MD  HPI: Carrie Bryant is a 73 y.o. female who presents for EGD/colonoscopy for evaluation of gastric intestinal metaplasia, bloating, and history of previous advanced rectal TVA post EMR.  Past Medical History:  Diagnosis Date   Anemia    Anxiety    Arthritis    Asthma    Depression    History of kidney stones    Hypercholesterolemia    Hypertension    Kidney stones    Thyroid disease    Past Surgical History:  Procedure Laterality Date   ABDOMINAL HYSTERECTOMY     BIOPSY  07/18/2020   Procedure: BIOPSY;  Surgeon: Lemar Lofty., MD;  Location: [facility] ENDOSCOPY;  Service: Gastroenterology;;   BIOPSY  02/27/2021   Procedure: BIOPSY;  Surgeon: Lemar Lofty., MD;  Location: [facility] ENDOSCOPY;  Service: Gastroenterology;;   COLONOSCOPY WITH PROPOFOL N/A 05/02/2020   Procedure: COLONOSCOPY WITH BIOPSY;  Surgeon: Pasty Spillers, MD;  Location: [facility]ERY [facility];  Service: Endoscopy;  Laterality: N/A;  PRIORITY 4   COLONOSCOPY WITH PROPOFOL N/A 07/18/2020   Procedure: COLONOSCOPY WITH PROPOFOL;  Surgeon: Meridee Score Netty Starring., MD;  Location: [facility] ENDOSCOPY;  Service: Gastroenterology;  Laterality: N/A;   COLONOSCOPY WITH PROPOFOL N/A 02/27/2021   Procedure: COLONOSCOPY WITH PROPOFOL;  Surgeon: Meridee Score Netty Starring., MD;  Location: [facility] ENDOSCOPY;  Service: Gastroenterology;  Laterality: N/A;   ENDOSCOPIC MUCOSAL RESECTION N/A 07/18/2020   Procedure: ENDOSCOPIC MUCOSAL RESECTION;  Surgeon: Meridee Score Netty Starring., MD;  Location: [facility] ENDOSCOPY;  Service: Gastroenterology;  Laterality: N/A;   ESOPHAGOGASTRODUODENOSCOPY (EGD) WITH PROPOFOL N/A 07/18/2020   Procedure: ESOPHAGOGASTRODUODENOSCOPY (EGD) WITH PROPOFOL;  Surgeon: Meridee Score Netty Starring., MD;  Location: [facility] ENDOSCOPY;  Service: Gastroenterology;  Laterality: N/A;   ESOPHAGOGASTRODUODENOSCOPY (EGD) WITH PROPOFOL N/A 02/27/2021    Procedure: ESOPHAGOGASTRODUODENOSCOPY (EGD) WITH PROPOFOL;  Surgeon: Meridee Score Netty Starring., MD;  Location: [facility] ENDOSCOPY;  Service: Gastroenterology;  Laterality: N/A;   HEMOSTASIS CLIP PLACEMENT  07/18/2020   Procedure: HEMOSTASIS CLIP PLACEMENT;  Surgeon: Lemar Lofty., MD;  Location: [facility] ENDOSCOPY;  Service: Gastroenterology;;   POLYPECTOMY N/A 05/02/2020   Procedure: POLYPECTOMY;  Surgeon: Pasty Spillers, MD;  Location: [facility]ERY [facility];  Service: Endoscopy;  Laterality: N/A;   POLYPECTOMY  07/18/2020   Procedure: POLYPECTOMY;  Surgeon: Meridee Score Netty Starring., MD;  Location: [facility] ENDOSCOPY;  Service: Gastroenterology;;   POLYPECTOMY  02/27/2021   Procedure: POLYPECTOMY;  Surgeon: Lemar Lofty., MD;  Location: [facility] ENDOSCOPY;  Service: Gastroenterology;;   SUBMUCOSAL LIFTING INJECTION  07/18/2020   Procedure: SUBMUCOSAL LIFTING INJECTION;  Surgeon: Lemar Lofty., MD;  Location: [facility] ENDOSCOPY;  Service: Gastroenterology;;   Current Outpatient Medications  Medication Sig Dispense Refill   albuterol (VENTOLIN HFA) 108 (90 Base) MCG/ACT inhaler Inhale 2 puffs into the lungs every 4 (four) hours as needed for wheezing or shortness of breath. 18 g 0   alendronate (FOSAMAX) 70 MG tablet Take 70 mg by mouth once a week.     amLODipine (NORVASC) 5 MG tablet Take 5 mg by mouth daily.     celecoxib (CELEBREX) 100 MG capsule Take 100 mg by mouth 2 (two) times daily.     Cholecalciferol (VITAMIN D-3) 25 MCG (1000 UT) CAPS Take 1,000 Units by mouth daily. (Patient not taking: Reported on 08/20/2023)     clonazePAM (KLONOPIN) 2 MG tablet Take 2 mg by mouth at bedtime.     cyclobenzaprine (FLEXERIL) 5 MG tablet Take 5  mg by mouth 3 (three) times daily as needed.     escitalopram (LEXAPRO) 20 MG tablet Take 20 mg by mouth daily.     esomeprazole (NEXIUM) 40 MG capsule Take 1 capsule (40 mg) by mouth every day or every other day 90 capsule 1   esomeprazole (NEXIUM) 40 MG  capsule TAKE 1 CAPSULE (40 MG TOTAL) BY MOUTH 2 (TWO) TIMES DAILY BEFORE A MEAL. 180 capsule 0   estradiol (ESTRACE) 0.1 MG/GM vaginal cream Place 1 Applicatorful vaginally at bedtime. Estrogen Cream Instruction Discard applicator Apply pea sized amount to tip of finger to urethra before bed. Wash hands well after application. Use Monday, Wednesday and Friday 42.5 g 2   ferrous sulfate 325 (65 FE) MG tablet Take 325 mg by mouth every other day.     fluticasone (FLONASE) 50 MCG/ACT nasal spray Place 2 sprays into both nostrils 2 (two) times daily.     furosemide (LASIX) 20 MG tablet Take 20 mg by mouth daily.     gabapentin (NEURONTIN) 100 MG capsule Take 100 mg by mouth at bedtime.     ibuprofen (ADVIL) 400 MG tablet Take 1 tablet (400 mg total) by mouth every 6 (six) hours as needed. 30 tablet 0   levothyroxine (SYNTHROID) 25 MCG tablet Take 25 mcg by mouth daily before breakfast.     montelukast (SINGULAIR) 10 MG tablet Take 10 mg by mouth at bedtime.     Multiple Vitamin (MULTIVITAMIN) tablet Take 1 tablet by mouth daily.     ondansetron (ZOFRAN) 4 MG tablet Take 1 tablet (4 mg total) by mouth as directed. Take 1 tablet 30 min - 1 hour before drinking each dose of your prep solution 2 tablet 0   ondansetron (ZOFRAN-ODT) 4 MG disintegrating tablet Take 1 tablet (4 mg total) by mouth every 8 (eight) hours as needed. (Patient not taking: Reported on 08/20/2023) 20 tablet 0   potassium chloride SA (KLOR-CON M) 20 MEQ tablet Take 2 tablets (40 mEq total) by mouth 2 (two) times daily for 3 doses. 6 tablet 0   rosuvastatin (CRESTOR) 20 MG tablet Take 20 mg by mouth daily.     simvastatin (ZOCOR) 40 MG tablet SMARTSIG:1 Tablet(s) By Mouth Every Evening (Patient not taking: Reported on 08/20/2023)     tamsulosin (FLOMAX) 0.4 MG CAPS capsule Take 1 capsule (0.4 mg total) by mouth daily. 30 capsule 0   tiZANidine (ZANAFLEX) 2 MG tablet Take 2 mg by mouth 2 (two) times daily as needed. (Patient not taking:  Reported on 08/20/2023)     tiZANidine (ZANAFLEX) 4 MG tablet Take 1 tablet (4 mg total) by mouth every 6 (six) hours as needed for muscle spasms. (Patient not taking: Reported on 08/20/2023) 30 tablet 0   traZODone (DESYREL) 150 MG tablet Take 150 mg by mouth at bedtime.     valsartan (DIOVAN) 80 MG tablet Take 80 mg by mouth daily.     vitamin B-12 (CYANOCOBALAMIN) 1000 MCG tablet Take 1,000 mcg by mouth daily.     No current facility-administered medications for this visit.    Current Outpatient Medications:    albuterol (VENTOLIN HFA) 108 (90 Base) MCG/ACT inhaler, Inhale 2 puffs into the lungs every 4 (four) hours as needed for wheezing or shortness of breath., Disp: 18 g, Rfl: 0   alendronate (FOSAMAX) 70 MG tablet, Take 70 mg by mouth once a week., Disp: , Rfl:    amLODipine (NORVASC) 5 MG tablet, Take 5 mg by mouth daily., Disp: ,  Rfl:    celecoxib (CELEBREX) 100 MG capsule, Take 100 mg by mouth 2 (two) times daily., Disp: , Rfl:    Cholecalciferol (VITAMIN D-3) 25 MCG (1000 UT) CAPS, Take 1,000 Units by mouth daily. (Patient not taking: Reported on 08/20/2023), Disp: , Rfl:    clonazePAM (KLONOPIN) 2 MG tablet, Take 2 mg by mouth at bedtime., Disp: , Rfl:    cyclobenzaprine (FLEXERIL) 5 MG tablet, Take 5 mg by mouth 3 (three) times daily as needed., Disp: , Rfl:    escitalopram (LEXAPRO) 20 MG tablet, Take 20 mg by mouth daily., Disp: , Rfl:    esomeprazole (NEXIUM) 40 MG capsule, Take 1 capsule (40 mg) by mouth every day or every other day, Disp: 90 capsule, Rfl: 1   esomeprazole (NEXIUM) 40 MG capsule, TAKE 1 CAPSULE (40 MG TOTAL) BY MOUTH 2 (TWO) TIMES DAILY BEFORE A MEAL., Disp: 180 capsule, Rfl: 0   estradiol (ESTRACE) 0.1 MG/GM vaginal cream, Place 1 Applicatorful vaginally at bedtime. Estrogen Cream Instruction Discard applicator Apply pea sized amount to tip of finger to urethra before bed. Wash hands well after application. Use Monday, Wednesday and Friday, Disp: 42.5 g, Rfl: 2    ferrous sulfate 325 (65 FE) MG tablet, Take 325 mg by mouth every other day., Disp: , Rfl:    fluticasone (FLONASE) 50 MCG/ACT nasal spray, Place 2 sprays into both nostrils 2 (two) times daily., Disp: , Rfl:    furosemide (LASIX) 20 MG tablet, Take 20 mg by mouth daily., Disp: , Rfl:    gabapentin (NEURONTIN) 100 MG capsule, Take 100 mg by mouth at bedtime., Disp: , Rfl:    ibuprofen (ADVIL) 400 MG tablet, Take 1 tablet (400 mg total) by mouth every 6 (six) hours as needed., Disp: 30 tablet, Rfl: 0   levothyroxine (SYNTHROID) 25 MCG tablet, Take 25 mcg by mouth daily before breakfast., Disp: , Rfl:    montelukast (SINGULAIR) 10 MG tablet, Take 10 mg by mouth at bedtime., Disp: , Rfl:    Multiple Vitamin (MULTIVITAMIN) tablet, Take 1 tablet by mouth daily., Disp: , Rfl:    ondansetron (ZOFRAN) 4 MG tablet, Take 1 tablet (4 mg total) by mouth as directed. Take 1 tablet 30 min - 1 hour before drinking each dose of your prep solution, Disp: 2 tablet, Rfl: 0   ondansetron (ZOFRAN-ODT) 4 MG disintegrating tablet, Take 1 tablet (4 mg total) by mouth every 8 (eight) hours as needed. (Patient not taking: Reported on 08/20/2023), Disp: 20 tablet, Rfl: 0   potassium chloride SA (KLOR-CON M) 20 MEQ tablet, Take 2 tablets (40 mEq total) by mouth 2 (two) times daily for 3 doses., Disp: 6 tablet, Rfl: 0   rosuvastatin (CRESTOR) 20 MG tablet, Take 20 mg by mouth daily., Disp: , Rfl:    simvastatin (ZOCOR) 40 MG tablet, SMARTSIG:1 Tablet(s) By Mouth Every Evening (Patient not taking: Reported on 08/20/2023), Disp: , Rfl:    tamsulosin (FLOMAX) 0.4 MG CAPS capsule, Take 1 capsule (0.4 mg total) by mouth daily., Disp: 30 capsule, Rfl: 0   tiZANidine (ZANAFLEX) 2 MG tablet, Take 2 mg by mouth 2 (two) times daily as needed. (Patient not taking: Reported on 08/20/2023), Disp: , Rfl:    tiZANidine (ZANAFLEX) 4 MG tablet, Take 1 tablet (4 mg total) by mouth every 6 (six) hours as needed for muscle spasms. (Patient not taking:  Reported on 08/20/2023), Disp: 30 tablet, Rfl: 0   traZODone (DESYREL) 150 MG tablet, Take 150 mg by mouth  at bedtime., Disp: , Rfl:    valsartan (DIOVAN) 80 MG tablet, Take 80 mg by mouth daily., Disp: , Rfl:    vitamin B-12 (CYANOCOBALAMIN) 1000 MCG tablet, Take 1,000 mcg by mouth daily., Disp: , Rfl:  Allergies  Allergen Reactions   Morphine And Codeine Anaphylaxis and Other (See Comments)    dizziness   Codeine Anxiety and Other (See Comments)    Hypertension (elevates BP) & chest pain.   Family History  Problem Relation Age of Onset   Colon cancer Neg Hx    Esophageal cancer Neg Hx    Inflammatory bowel disease Neg Hx    Liver disease Neg Hx    Pancreatic cancer Neg Hx    Rectal cancer Neg Hx    Stomach cancer Neg Hx    Social History   Socioeconomic History   Marital status: Married    Spouse name: Not on file   Number of children: Not on file   Years of education: Not on file   Highest education level: Not on file  Occupational History   Not on file  Tobacco Use   Smoking status: Never    Passive exposure: Never   Smokeless tobacco: Never  Vaping Use   Vaping status: Never Used  Substance and Sexual Activity   Alcohol use: Never   Drug use: Never   Sexual activity: Not on file  Other Topics Concern   Not on file  Social History Narrative   Not on file   Social Drivers of Health   Financial Resource Strain: Not on file  Food Insecurity: Not on file  Transportation Needs: Not on file  Physical Activity: Not on file  Stress: Not on file  Social Connections: Not on file  Intimate Partner Violence: Not on file    Physical Exam: There were no vitals filed for this visit. There is no height or weight on file to calculate BMI. GEN: NAD EYE: Sclerae anicteric ENT: MMM CV: Non-tachycardic GI: Soft, NT/ND NEURO:  Alert & Oriented x 3  Lab Results: No results for input(s): "WBC", "HGB", "HCT", "PLT" in the last 72 hours. BMET No results for input(s):  "NA", "K", "CL", "CO2", "GLUCOSE", "BUN", "CREATININE", "CALCIUM" in the last 72 hours. LFT No results for input(s): "PROT", "ALBUMIN", "AST", "ALT", "ALKPHOS", "BILITOT", "BILIDIR", "IBILI" in the last 72 hours. PT/INR No results for input(s): "LABPROT", "INR" in the last 72 hours.   Impression / Plan: This is a 73 y.o.female who presents for EGD/colonoscopy for evaluation of gastric intestinal metaplasia, bloating, and history of previous advanced rectal TVA post EMR.  The risks and benefits of endoscopic evaluation/treatment were discussed with the patient and/or family; these include but are not limited to the risk of perforation, infection, bleeding, missed lesions, lack of diagnosis, severe illness requiring hospitalization, as well as anesthesia and sedation related illnesses.  The patient's history has been reviewed, patient examined, no change in status, and deemed stable for procedure.  The patient and/or family is agreeable to proceed.    Corliss Parish, MD [facility] Advanced Endoscopy Office # [phone]

## 2023-08-23 NOTE — Patient Instructions (Addendum)
 Handouts provided about hiatal hernia, hemorrhoids, high fiber diet, and polyps.  Continue present medications.  Use FiberCon 1-2 tablets orally every day.   Linzess daily prescription sent to [facility] in [place].  Await pathology results.   Repeat upper endoscopy in 3 years for surveillance, unless and dysphagia changes are noted on pathology.   Repeat colonoscopy in 3-5 years for surveillance based on pathology results.  Due to patient's history of CVA with HGD, she should never go more than 5 years between procedures.      YOU HAD AN ENDOSCOPIC PROCEDURE TODAY AT THE [facility]:   Refer to the procedure report that was given to you for any specific questions about what was found during the examination.  If the procedure report does not answer your questions, please call your gastroenterologist to clarify.  If you requested that your care partner not be given the details of your procedure findings, then the procedure report has been included in a sealed envelope for you to review at your convenience later.  YOU SHOULD EXPECT: Some feelings of bloating in the abdomen. Passage of more gas than usual.  Walking can help get rid of the air that was put into your GI tract during the procedure and reduce the bloating. If you had a lower endoscopy (such as a colonoscopy or flexible sigmoidoscopy) you may notice spotting of blood in your stool or on the toilet paper. If you underwent a bowel prep for your procedure, you may not have a normal bowel movement for a few days.  Please Note:  You might notice some irritation and congestion in your nose or some drainage.  This is from the oxygen used during your procedure.  There is no need for concern and it should clear up in a day or so.  SYMPTOMS TO REPORT IMMEDIATELY:  Following lower endoscopy (colonoscopy or flexible sigmoidoscopy):  Excessive amounts of blood in the stool  Significant tenderness or worsening of abdominal  pains  Swelling of the abdomen that is new, acute  Fever of 100F or higher  Following upper endoscopy (EGD)  Vomiting of blood or coffee ground material  New chest pain or pain under the shoulder blades  Painful or persistently difficult swallowing  New shortness of breath  Fever of 100F or higher  Black, tarry-looking stools  For urgent or emergent issues, a gastroenterologist can be reached at any hour by calling [phone]. Do not use MyChart messaging for urgent concerns.    DIET:  We do recommend a small meal at first, but then you may proceed to your regular diet.  Drink plenty of fluids but you should avoid alcoholic beverages for 24 hours.  ACTIVITY:  You should plan to take it easy for the rest of today and you should NOT DRIVE or use heavy machinery until tomorrow (because of the sedation medicines used during the test).    FOLLOW UP: Our staff will call the number listed on your records the next business day following your procedure.  We will call around 7:15- 8:00 am to check on you and address any questions or concerns that you may have regarding the information given to you following your procedure. If we do not reach you, we will leave a message.     If any biopsies were taken you will be contacted by phone or by letter within the next 1-3 weeks.  Please call us at [phone] if you have not heard about the biopsies in  3 weeks.    SIGNATURES/CONFIDENTIALITY: You and/or your care partner have signed paperwork which will be entered into your electronic medical record.  These signatures attest to the fact that that the information above on your After Visit Summary has been reviewed and is understood.  Full responsibility of the confidentiality of this discharge information lies with you and/or your care-partner.

## 2023-08-23 NOTE — Progress Notes (Signed)
 Pt's states no medical or surgical changes since previsit or office visit.

## 2023-08-23 NOTE — Op Note (Signed)
 [facility] Patient Name: Carrie Bryant Procedure Date: 08/23/2023 10:03 AM MRN: [id number] Endoscopist: Corliss Parish , MD, [id number] Age: 73 Referring MD:  Date of Birth: September 11, 1950 Gender: Female Account #: [id number] Procedure:                Upper GI endoscopy Indications:              Follow-up of intestinal metaplasia Medicines:                Monitored Anesthesia Care Procedure:                Pre-Anesthesia Assessment:                           - Prior to the procedure, a History and Physical                            was performed, and patient medications and                            allergies were reviewed. The patient's tolerance of                            previous anesthesia was also reviewed. The risks                            and benefits of the procedure and the sedation                            options and risks were discussed with the patient.                            All questions were answered, and informed consent                            was obtained. Prior Anticoagulants: The patient has                            taken no anticoagulant or antiplatelet agents                            except for NSAID medication. ASA Grade Assessment:                            II - A patient with mild systemic disease. After                            reviewing the risks and benefits, the patient was                            deemed in satisfactory condition to undergo the                            procedure.  After obtaining informed consent, the endoscope was                            passed under direct vision. Throughout the                            procedure, the patient's blood pressure, pulse, and                            oxygen saturations were monitored continuously. The                            Olympus Scope [id number] was introduced through the                            mouth, and advanced to the  second part of duodenum.                            The upper GI endoscopy was accomplished without                            difficulty. The patient tolerated the procedure. Scope In: Scope Out: Findings:                 No gross lesions were noted in the entire esophagus.                           The Z-line was irregular and was found 33 cm from                            the incisors.                           A 2 cm hiatal hernia was present.                           Diffuse mildly erythematous mucosa without bleeding                            was found in the entire examined stomach. Due to                            the patient's history of gastric intestinal                            metaplasia, gastric mapping biopsies were performed                            today. Biopsies were taken with a cold forceps for                            histology from the cardia and fundus. Biopsies were  taken with a cold forceps for histology from the                            greater curve and lesser curve of the gastric body.                            Biopsies were taken with a cold forceps for                            histology from the antrum and incisura.                           No gross lesions were noted in the duodenal bulb,                            in the first portion of the duodenum and in the                            second portion of the duodenum. Complications:            No immediate complications. Estimated Blood Loss:     Estimated blood loss was minimal. Impression:               - No gross lesions in the entire esophagus. Z-line                            irregular, 33 cm from the incisors.                           - 2 cm hiatal hernia.                           - Erythematous mucosa in the stomach. Biopsied.                           - No gross lesions in the duodenal bulb, in the                            first portion of the  duodenum and in the second                            portion of the duodenum. Recommendation:           - Proceed to scheduled colonoscopy.                           - Continue present medications.                           - Await pathology results.                           - Repeat upper endoscopy in 3 years for  surveillance, unless any dysplastic changes are                            noted on pathology.                           - The findings and recommendations were discussed                            with the patient.                           - The findings and recommendations were discussed                            with the patient's family. Corliss Parish, MD 08/23/2023 11:17:40 AM

## 2023-08-23 NOTE — Progress Notes (Signed)
 Report given to PACU, vss

## 2023-08-23 NOTE — Progress Notes (Signed)
 Called to room to assist during endoscopic procedure.  Patient ID and intended procedure confirmed with present staff. Received instructions for my participation in the procedure from the performing physician.

## 2023-08-23 NOTE — Progress Notes (Signed)
1041 Robinul 0.1 mg IV given due large amount of secretions upon assessment.  MD made aware, vss  °

## 2023-08-26 ENCOUNTER — Telehealth: Payer: Self-pay | Admitting: *Deleted

## 2023-08-26 NOTE — Telephone Encounter (Signed)
  Follow up Call-     08/23/2023   10:14 AM  Call back number  Post procedure Call Back phone  # Call daughter Camila Li [phone]  Permission to leave phone message Yes     Patient questions:  Do you have a fever, pain , or abdominal swelling? No. Pain Score  0 *  Have you tolerated food without any problems? Yes.    Have you been able to return to your normal activities? Yes.    Do you have any questions about your discharge instructions: Diet   No. Medications  No. Follow up visit  No.  Do you have questions or concerns about your Care? No.  Actions: * If pain score is 4 or above: No action needed, pain <4.

## 2023-08-28 LAB — SURGICAL PATHOLOGY

## 2023-09-03 ENCOUNTER — Encounter: Payer: Self-pay | Admitting: Gastroenterology

## 2023-09-19 ENCOUNTER — Telehealth: Payer: Self-pay | Admitting: Urology

## 2023-09-19 NOTE — Telephone Encounter (Signed)
 Patient sent appointment request through mychart, but did not state what she was needing to be seen for. Carrie Bryant called patient to get information, and schedule appt, but had to leave vm asking patient to call office.

## 2023-10-01 NOTE — Progress Notes (Signed)
 10/02/2023 9:31 PM   Carrie Bryant 05/19/1951 [id number]  Referring provider: Preston Fleeting, MD [address] [place],  [place] [address]  Urological history: 1.  Nephrolithiasis -remote history - URS/MET -CT renal stone study (03/2023) -tiny bilateral renal  2. Pelvic pain/dysuria -likely secondary to vaginal atrophy -cysto, 09/2021 - NED -urine cytology, 09/2021 - negative  3. Renal cysts -Contrast CT (08/2022)  -  Multiple BILATERAL renal cysts  Chief Complaint  Patient presents with   Follow-up   HPI: Carrie Bryant is a 73 y.o. female who presents today for not able to void completely, pain when urinating with her daughter, Camila Li and interpreter, Marchelle Folks.    Reviewed previous records  She states she has had dysuria on and off since she last saw me in August.  She is applying the vaginal estrogen cream three nights weekly.     She is experiencing dysuria, a weak urinary stream, lower back pain, lower abdominal pain and constipation.  Patient denies any modifying or aggravating factors.  Patient denies any flank pain.  Patient denies any fevers, chills, nausea or vomiting.    UA on AZO, so no dip - 11-30 WBC's, > 30 RBC's,  0-10  epithelial cells, granular casts, hyaline casts, mucus threads present and moderate bacteria.    PVR 0 mL  PMH: Past Medical History:  Diagnosis Date   Anemia    Anxiety    Arthritis    Asthma    Depression    History of kidney stones    Hypercholesterolemia    Hypertension    Kidney stones    Thyroid disease     Surgical History: Past Surgical History:  Procedure Laterality Date   ABDOMINAL HYSTERECTOMY     BIOPSY  07/18/2020   Procedure: BIOPSY;  Surgeon: Lemar Lofty., MD;  Location: [facility] ENDOSCOPY;  Service: Gastroenterology;;   BIOPSY  02/27/2021   Procedure: BIOPSY;  Surgeon: Lemar Lofty., MD;  Location: [facility] ENDOSCOPY;  Service: Gastroenterology;;   COLONOSCOPY WITH PROPOFOL N/A  05/02/2020   Procedure: COLONOSCOPY WITH BIOPSY;  Surgeon: Pasty Spillers, MD;  Location: [facility] SURGERY CNTR;  Service: Endoscopy;  Laterality: N/A;  PRIORITY 4   COLONOSCOPY WITH PROPOFOL N/A 07/18/2020   Procedure: COLONOSCOPY WITH PROPOFOL;  Surgeon: Meridee Score Netty Starring., MD;  Location: [facility] ENDOSCOPY;  Service: Gastroenterology;  Laterality: N/A;   COLONOSCOPY WITH PROPOFOL N/A 02/27/2021   Procedure: COLONOSCOPY WITH PROPOFOL;  Surgeon: Meridee Score Netty Starring., MD;  Location: [facility] ENDOSCOPY;  Service: Gastroenterology;  Laterality: N/A;   ENDOSCOPIC MUCOSAL RESECTION N/A 07/18/2020   Procedure: ENDOSCOPIC MUCOSAL RESECTION;  Surgeon: Meridee Score Netty Starring., MD;  Location: [facility] ENDOSCOPY;  Service: Gastroenterology;  Laterality: N/A;   ESOPHAGOGASTRODUODENOSCOPY (EGD) WITH PROPOFOL N/A 07/18/2020   Procedure: ESOPHAGOGASTRODUODENOSCOPY (EGD) WITH PROPOFOL;  Surgeon: Meridee Score Netty Starring., MD;  Location: [facility] ENDOSCOPY;  Service: Gastroenterology;  Laterality: N/A;   ESOPHAGOGASTRODUODENOSCOPY (EGD) WITH PROPOFOL N/A 02/27/2021   Procedure: ESOPHAGOGASTRODUODENOSCOPY (EGD) WITH PROPOFOL;  Surgeon: Meridee Score Netty Starring., MD;  Location: [facility] ENDOSCOPY;  Service: Gastroenterology;  Laterality: N/A;   HEMOSTASIS CLIP PLACEMENT  07/18/2020   Procedure: HEMOSTASIS CLIP PLACEMENT;  Surgeon: Lemar Lofty., MD;  Location: [facility] ENDOSCOPY;  Service: Gastroenterology;;   POLYPECTOMY N/A 05/02/2020   Procedure: POLYPECTOMY;  Surgeon: Pasty Spillers, MD;  Location: [facility] SURGERY [facility]TR;  Service: Endoscopy;  Laterality: N/A;   POLYPECTOMY  07/18/2020   Procedure: POLYPECTOMY;  Surgeon: Meridee Score Netty Starring., MD;  Location: [facility] ENDOSCOPY;  Service: Gastroenterology;;   POLYPECTOMY  02/27/2021   Procedure: POLYPECTOMY;  Surgeon: Lemar Lofty., MD;  Location: [facility] ENDOSCOPY;  Service: Gastroenterology;;   Brooke Dare INJECTION  07/18/2020   Procedure: SUBMUCOSAL LIFTING INJECTION;   Surgeon: Lemar Lofty., MD;  Location: [facility] ENDOSCOPY;  Service: Gastroenterology;;    Home Medications:  Allergies as of 10/02/2023       Reactions   Morphine And Codeine Anaphylaxis, Other (See Comments)   dizziness   Codeine Anxiety, Other (See Comments)   Hypertension (elevates BP) & chest pain.        Medication List        Accurate as of October 02, 2023 11:59 PM. If you have any questions, ask your nurse or doctor.          albuterol 108 (90 Base) MCG/ACT inhaler Commonly known as: VENTOLIN HFA Inhale 2 puffs into the lungs every 4 (four) hours as needed for wheezing or shortness of breath.   alendronate 70 MG tablet Commonly known as: FOSAMAX Take 70 mg by mouth once a week.   amLODipine 5 MG tablet Commonly known as: NORVASC Take 5 mg by mouth daily.   celecoxib 100 MG capsule Commonly known as: CELEBREX Take 100 mg by mouth 2 (two) times daily.   clonazePAM 2 MG tablet Commonly known as: KLONOPIN Take 2 mg by mouth at bedtime.   cyanocobalamin 1000 MCG tablet Commonly known as: VITAMIN B12 Take 1,000 mcg by mouth daily.   cyclobenzaprine 5 MG tablet Commonly known as: FLEXERIL Take 5 mg by mouth 3 (three) times daily as needed.   escitalopram 20 MG tablet Commonly known as: LEXAPRO Take 20 mg by mouth daily.   esomeprazole 40 MG capsule Commonly known as: NEXIUM Take 1 capsule (40 mg) by mouth every day or every other day   esomeprazole 40 MG capsule Commonly known as: NEXIUM TAKE 1 CAPSULE (40 MG TOTAL) BY MOUTH 2 (TWO) TIMES DAILY BEFORE A MEAL.   estradiol 0.1 MG/GM vaginal cream Commonly known as: ESTRACE Place 1 Applicatorful vaginally at bedtime. Estrogen Cream Instruction Discard applicator Apply pea sized amount to tip of finger to urethra before bed. Wash hands well after application. Use Monday, Wednesday and Friday   ferrous sulfate 325 (65 FE) MG tablet Take 325 mg by mouth every other day.   fluticasone 50 MCG/ACT nasal  spray Commonly known as: FLONASE Place 2 sprays into both nostrils 2 (two) times daily.   furosemide 20 MG tablet Commonly known as: LASIX Take 20 mg by mouth daily.   gabapentin 100 MG capsule Commonly known as: NEURONTIN Take 100 mg by mouth at bedtime.   ibuprofen 400 MG tablet Commonly known as: ADVIL Take 1 tablet (400 mg total) by mouth every 6 (six) hours as needed.   levothyroxine 25 MCG tablet Commonly known as: SYNTHROID Take 25 mcg by mouth daily before breakfast.   linaclotide 72 MCG capsule Commonly known as: Linzess Take 1 capsule (72 mcg total) by mouth daily before breakfast.   montelukast 10 MG tablet Commonly known as: SINGULAIR Take 10 mg by mouth at bedtime.   multivitamin tablet Take 1 tablet by mouth daily.   ondansetron 4 MG tablet Commonly known as: ZOFRAN Take 1 tablet (4 mg total) by mouth as directed. Take 1 tablet 30 min - 1 hour before drinking each dose of your prep solution   potassium chloride SA 20 MEQ tablet Commonly known as: KLOR-CON M Take 2 tablets (40 mEq total)  by mouth 2 (two) times daily for 3 doses.   rosuvastatin 20 MG tablet Commonly known as: CRESTOR Take 20 mg by mouth daily.   tamsulosin 0.4 MG Caps capsule Commonly known as: FLOMAX Take 1 capsule (0.4 mg total) by mouth daily.   traZODone 150 MG tablet Commonly known as: DESYREL Take 150 mg by mouth at bedtime.   valsartan 80 MG tablet Commonly known as: DIOVAN Take 80 mg by mouth daily.   Vitamin D-3 25 MCG (1000 UT) Caps Take 1,000 Units by mouth daily.        Allergies:  Allergies  Allergen Reactions   Morphine And Codeine Anaphylaxis and Other (See Comments)    dizziness   Codeine Anxiety and Other (See Comments)    Hypertension (elevates BP) & chest pain.    Family History: Family History  Problem Relation Age of Onset   Colon cancer Neg Hx    Esophageal cancer Neg Hx    Inflammatory bowel disease Neg Hx    Liver disease Neg Hx     Pancreatic cancer Neg Hx    Rectal cancer Neg Hx    Stomach cancer Neg Hx     Social History:  reports that she has never smoked. She has never been exposed to tobacco smoke. She has never used smokeless tobacco. She reports that she does not drink alcohol and does not use drugs.  ROS: Pertinent ROS in HPI  Physical Exam: BP (!) 146/80   Pulse 77   Constitutional:  Well nourished. Alert and oriented, No acute distress. HEENT: [place] AT, moist mucus membranes.  Trachea midline Cardiovascular: No clubbing, cyanosis, or edema. Respiratory: Normal respiratory effort, no increased work of breathing. Neurologic: Grossly intact, no focal deficits, moving all 4 extremities. Psychiatric: Normal mood and affect.    Laboratory Data: Urinalysis See EPIC and HPI I have reviewed the labs.   Pertinent Imaging:  10/02/23 15:02  Scan Result 0 ml   Assessment & Plan:    1. Vaginal atrophy -Continue vaginal estrogen cream 3 times weekly  2. Dysuria  -UA grossly infected  -Urine culture pending -will wait on prescribing an antibiotic until culture results available  3. Feelings of incomplete bladder emptying -PVR demonstrates adequate emptying  4. Microscopic hematuria -UA w/ mico heme, but grossly infected -culture is pending -will need follow up in two months to confirm symptom resolution and resolution of micro heme -if urine culture is negative, will pursue a CT urogram   Return for pending urine culture results .  These notes generated with voice recognition software. I apologize for typographical errors.  Cloretta Ned  [facility] [address]  [address] [place], [place] [address] [phone]

## 2023-10-02 ENCOUNTER — Ambulatory Visit (INDEPENDENT_AMBULATORY_CARE_PROVIDER_SITE_OTHER): Admitting: Urology

## 2023-10-02 VITALS — BP 146/80 | HR 77

## 2023-10-02 DIAGNOSIS — R3914 Feeling of incomplete bladder emptying: Secondary | ICD-10-CM

## 2023-10-02 DIAGNOSIS — N952 Postmenopausal atrophic vaginitis: Secondary | ICD-10-CM | POA: Diagnosis not present

## 2023-10-02 DIAGNOSIS — R300 Dysuria: Secondary | ICD-10-CM | POA: Diagnosis not present

## 2023-10-02 LAB — URINALYSIS, COMPLETE

## 2023-10-02 LAB — BLADDER SCAN AMB NON-IMAGING: Scan Result: 0

## 2023-10-02 LAB — MICROSCOPIC EXAMINATION: RBC, Urine: >30 /HPF — AB (ref 0–2)

## 2023-10-05 ENCOUNTER — Encounter: Payer: Self-pay | Admitting: Urology

## 2023-10-06 ENCOUNTER — Other Ambulatory Visit: Payer: Self-pay | Admitting: Urology

## 2023-10-06 DIAGNOSIS — R3129 Other microscopic hematuria: Secondary | ICD-10-CM

## 2023-10-06 LAB — CULTURE, URINE COMPREHENSIVE

## 2023-11-01 ENCOUNTER — Other Ambulatory Visit: Payer: Self-pay | Admitting: Nurse Practitioner

## 2024-01-21 ENCOUNTER — Encounter: Payer: Self-pay | Admitting: Obstetrics and Gynecology

## 2024-01-21 ENCOUNTER — Ambulatory Visit (INDEPENDENT_AMBULATORY_CARE_PROVIDER_SITE_OTHER): Admitting: Obstetrics and Gynecology

## 2024-01-21 ENCOUNTER — Other Ambulatory Visit (HOSPITAL_COMMUNITY)
Admission: RE | Admit: 2024-01-21 | Discharge: 2024-01-21 | Disposition: A | Discharge status: Home / Self Care | Attending: Obstetrics and Gynecology | Admitting: Obstetrics and Gynecology

## 2024-01-21 VITALS — BP 133/77 | HR 76 | Ht 58.4 in | Wt 156.0 lb

## 2024-01-21 DIAGNOSIS — N3289 Other specified disorders of bladder: Secondary | ICD-10-CM | POA: Diagnosis not present

## 2024-01-21 DIAGNOSIS — M6289 Other specified disorders of muscle: Secondary | ICD-10-CM

## 2024-01-21 DIAGNOSIS — R319 Hematuria, unspecified: Secondary | ICD-10-CM | POA: Diagnosis not present

## 2024-01-21 DIAGNOSIS — N814 Uterovaginal prolapse, unspecified: Secondary | ICD-10-CM

## 2024-01-21 DIAGNOSIS — R350 Frequency of micturition: Secondary | ICD-10-CM

## 2024-01-21 DIAGNOSIS — M62838 Other muscle spasm: Secondary | ICD-10-CM

## 2024-01-21 LAB — POCT URINALYSIS DIP (CLINITEK)
Bilirubin, UA: NEGATIVE
Glucose, UA: NEGATIVE mg/dL
Ketones, POC UA: NEGATIVE mg/dL
Leukocytes, UA: NEGATIVE
Nitrite, UA: NEGATIVE
POC PROTEIN,UA: NEGATIVE
Spec Grav, UA: 1.015 (ref 1.010–1.025)
Urobilinogen, UA: 0.2 U/dL
pH, UA: 7 (ref 5.0–8.0)

## 2024-01-21 LAB — URINALYSIS, ROUTINE W REFLEX MICROSCOPIC
Bilirubin Urine: NEGATIVE
Glucose, UA: NEGATIVE mg/dL
Ketones, ur: NEGATIVE mg/dL
Leukocytes,Ua: NEGATIVE
Nitrite: NEGATIVE
Protein, ur: NEGATIVE mg/dL
Specific Gravity, Urine: 1.014 (ref 1.005–1.030)
pH: 6 (ref 5.0–8.0)

## 2024-01-21 MED ORDER — BUPIVACAINE HCL 0.25 % IJ SOLN
20.0000 mL | Freq: Once | INTRAMUSCULAR | Status: AC
Start: 2024-01-21 — End: 2024-01-21
  Administered 2024-01-21: 20 mL

## 2024-01-21 MED ORDER — SODIUM BICARBONATE 8.4 % IV SOLN
5.0000 mL | Freq: Once | INTRAVENOUS | Status: AC
  Administered 2024-01-21: 5 mL

## 2024-01-21 MED ORDER — METHENAMINE HIPPURATE 1 G PO TABS: 1.0000 g | ORAL_TABLET | Freq: Every day | ORAL | 2 refills | Status: AC

## 2024-01-21 MED ORDER — LIDOCAINE HCL 2 % IJ SOLN
20.0000 mL | Freq: Once | INTRAMUSCULAR | Status: AC
  Administered 2024-01-21: 400 mg

## 2024-01-21 MED ORDER — HEPARIN SODIUM (PORCINE) 10000 UNIT/ML IJ SOLN
10000.0000 [IU] | Freq: Once | INTRAMUSCULAR | Status: AC
  Administered 2024-01-21: 10000 [IU] via INTRAVESICAL

## 2024-01-21 NOTE — Progress Notes (Signed)
 [facility] New Patient Evaluation and Consultation  Due to language barrier, an interpreter was present during the history-taking and subsequent discussion (and for part of the physical exam) with this patient.  Referring Provider: Salli Amato, MD PCP: Ernie Yancy Roof, MD Date of Service: 01/21/2024  SUBJECTIVE Chief Complaint: New Patient (Initial Visit) [facility] Carole Orlando is a 73 y.o. female is here for mixed incontinence.)  History of Present Illness: Zamariya Neal is a 73 y.o. Hispanic female seen in consultation at the request of Dr. Salli for evaluation of Mixed incontinence .  Patient presents with her daughter and interpreter.   Review of records significant for: Note from Dr. Salli 10/15/23:  She reports urinary issues, including burning and pain during urination, which have been ongoing for about two years and have become more frequent. She is under the care of a urologist and has been advised to see a gynecologist due to a prolapse issue causing stress incontinence.   Being seen in Urology for Microscopic hematuria and they have ordered a CT. Cysto done 09/2021. Hx of renal cysts multiple bilateral renal cysts.   CT Renal stone study on 03/08/23 CLINICAL DATA:  Abdominal and flank pain.   EXAM: CT ABDOMEN AND PELVIS WITHOUT CONTRAST   TECHNIQUE: Multidetector CT imaging of the abdomen and pelvis was performed following the standard protocol without IV contrast.   RADIATION DOSE REDUCTION: This exam was performed according to the departmental dose-optimization program which includes automated exposure control, adjustment of the mA and/or kV according to patient size and/or use of iterative reconstruction technique.   COMPARISON:  09/12/2022   FINDINGS: Lower chest: No acute findings.   Hepatobiliary: No mass visualized on this unenhanced exam. Stable small cyst again seen in the left hepatic lobe. Gallbladder is unremarkable. No evidence  of biliary ductal dilatation.   Pancreas: No mass or inflammatory process visualized on this unenhanced exam.   Spleen:  Within normal limits in size.   Adrenals/Urinary tract: Tiny sub-centimeter renal calculi are seen bilaterally. Mild left renal parenchymal scarring noted. No evidence of ureteral calculi or hydronephrosis. Unremarkable unopacified urinary bladder.   Stomach/Bowel: No evidence of obstruction, inflammatory process, or abnormal fluid collections. Normal appendix visualized.   Vascular/Lymphatic: No pathologically enlarged lymph nodes identified. No evidence of abdominal aortic aneurysm.   Reproductive: Few tiny uterine fibroids are seen measuring up to 1 cm. Adnexal regions are unremarkable.   Other:  None.   Musculoskeletal:  No suspicious bone lesions identified.   IMPRESSION: Tiny bilateral renal calculi. No evidence of ureteral calculi, hydronephrosis, or other acute findings.   Tiny uterine fibroids.     Electronically Signed   By: Norleen DELENA Kil M.D.   On: 03/18/2023 08:21  Urinary Symptoms: Does not leak urine.  Pad use: 2 liners/ mini-pads per day.    Day time voids 5.  Nocturia: 2 times per night to void. Voiding dysfunction:  does not empty bladder well.  Patient does not use a catheter to empty bladder.  When urinating, patient feels difficulty starting urine stream and the need to urinate multiple times in a row Drinks: 2 cups coffee, Orange juice, and water  per day  UTIs: 3 UTI's in the last year.  (Cultures negative for infection) Reports history of blood in urine No results found for the last 90 days.   Pelvic Organ Prolapse Symptoms:                  Patient Denies a feeling of  a bulge the vaginal area.   Bowel Symptom: Bowel movements: 2 time(s) per day Stool consistency: soft  Straining: yes.  Splinting: no.  Incomplete evacuation: no.  Patient Denies accidental bowel leakage / fecal incontinence Bowel regimen: fiber and  stool softener Last colonoscopy: Date 2025, Results WNL [facility] Colonoscopy          Upcoming     Colonoscopy (Every 5 Years) Next due on 08/22/2028    08/23/2023  COLONOSCOPY   Only the first 1 history entries have been loaded, but more history exists.                Sexual Function Sexually active: no.  Sexual orientation: Straight Pain with sex: No  Pelvic Pain Admits to pelvic pain Location: Uterus Pain occurs: When she attempted to uriante Prior pain treatment: Estrogen cream and pills Improved by: Nothing Worsened by: Not urinating   Past Medical History:  Past Medical History:  Diagnosis Date   Anemia    Anxiety    Arthritis    Asthma    Depression    History of kidney stones    Hypercholesterolemia    Hypertension    Kidney stones    Thyroid  disease      Past Surgical History:   Past Surgical History:  Procedure Laterality Date   ABDOMINAL HYSTERECTOMY     BIOPSY  07/18/2020   Procedure: BIOPSY;  Surgeon: Wilhelmenia Aloha Raddle., MD;  Location: [facility] ENDOSCOPY;  Service: Gastroenterology;;   BIOPSY  02/27/2021   Procedure: BIOPSY;  Surgeon: Wilhelmenia Aloha Raddle., MD;  Location: [facility];  Service: Gastroenterology;;   COLONOSCOPY WITH PROPOFOL  N/A 05/02/2020   Procedure: COLONOSCOPY WITH BIOPSY;  Surgeon: Janalyn Keene NOVAK, MD;  Location: [facility];  Service: Endoscopy;  Laterality: N/A;  PRIORITY 4   COLONOSCOPY WITH PROPOFOL  N/A 07/18/2020   Procedure: COLONOSCOPY WITH PROPOFOL ;  Surgeon: Mansouraty, Aloha Raddle., MD;  Location: [facility]COPY;  Service: Gastroenterology;  Laterality: N/A;   COLONOSCOPY WITH PROPOFOL  N/A 02/27/2021   Procedure: COLONOSCOPY WITH PROPOFOL ;  Surgeon: Mansouraty, Aloha Raddle., MD;  Location: [facility] ENDOSCOPY;  Service: Gastroenterology;  Laterality: N/A;   ENDOSCOPIC MUCOSAL RESECTION N/A 07/18/2020   Procedure: ENDOSCOPIC MUCOSAL RESECTION;  Surgeon: Wilhelmenia Aloha Raddle., MD;  Location: [facility] ENDOSCOPY;  Service:  Gastroenterology;  Laterality: N/A;   ESOPHAGOGASTRODUODENOSCOPY (EGD) WITH PROPOFOL  N/A 07/18/2020   Procedure: ESOPHAGOGASTRODUODENOSCOPY (EGD) WITH PROPOFOL ;  Surgeon: Wilhelmenia Aloha Raddle., MD;  Location: [facility] ENDOSCOPY;  Service: Gastroenterology;  Laterality: N/A;   ESOPHAGOGASTRODUODENOSCOPY (EGD) WITH PROPOFOL  N/A 02/27/2021   Procedure: ESOPHAGOGASTRODUODENOSCOPY (EGD) WITH PROPOFOL ;  Surgeon: Wilhelmenia Aloha Raddle., MD;  Location: [facility] ENDOSCOPY;  Service: Gastroenterology;  Laterality: N/A;   HEMOSTASIS CLIP PLACEMENT  07/18/2020   Procedure: HEMOSTASIS CLIP PLACEMENT;  Surgeon: Wilhelmenia Aloha Raddle., MD;  Location: [facility] ENDOSCOPY;  Service: Gastroenterology;;   POLYPECTOMY N/A 05/02/2020   Procedure: POLYPECTOMY;  Surgeon: Janalyn Keene NOVAK, MD;  Location: [facility];  Service: Endoscopy;  Laterality: N/A;   POLYPECTOMY  07/18/2020   Procedure: POLYPECTOMY;  Surgeon: Wilhelmenia Aloha Raddle., MD;  Location: [facility] ENDOSCOPY;  Service: Gastroenterology;;   POLYPECTOMY  02/27/2021   Procedure: POLYPECTOMY;  Surgeon: Wilhelmenia Aloha Raddle., MD;  Location: [facility];  Service: Gastroenterology;;   SUBMUCOSAL LIFTING INJECTION  07/18/2020   Procedure: SUBMUCOSAL LIFTING INJECTION;  Surgeon: Wilhelmenia Aloha Raddle., MD;  Location: [facility] ENDOSCOPY;  Service: Gastroenterology;;     Past OB/GYN History: [id number] Menopausal: Yes, at age 87 Contraception: none. Last pap smear was  2024.  Any history of abnormal pap smears: no. [facility] PAP   This patient has no relevant Health Maintenance data.     Medications: Patient has a current medication list which includes the following prescription(s): albuterol , alendronate, amlodipine, azelastine, vitamin d-3, clonazepam, cyclobenzaprine, escitalopram, esomeprazole , estradiol , ferrous sulfate, fluticasone, furosemide, gabapentin, levothyroxine, linaclotide , montelukast, multivitamin, potassium chloride  sa, prednisone , rosuvastatin, senna, simethicone,  trazodone, valsartan, and cyanocobalamin.   Allergies: Patient is allergic to morphine and codeine and codeine.   Social History:  Social History   Tobacco Use   Smoking status: Never    Passive exposure: Never   Smokeless tobacco: Never  Vaping Use   Vaping status: Never Used  Substance Use Topics   Alcohol use: Never   Drug use: Never    Relationship status: married Patient lives with her daughter.   Patient is not employed. Regular exercise: No History of abuse: No  Family History:   Family History  Problem Relation Age of Onset   Heart disease Mother    Kidney disease Sister    Heart disease Paternal Uncle    Diabetes Paternal Uncle    Colon cancer Neg Hx    Esophageal cancer Neg Hx    Inflammatory bowel disease Neg Hx    Liver disease Neg Hx    Pancreatic cancer Neg Hx    Rectal cancer Neg Hx    Stomach cancer Neg Hx      Review of Systems: Review of Systems  Constitutional:  Negative for chills and fever.  Respiratory:  Negative for cough and shortness of breath.   Cardiovascular:  Negative for chest pain and palpitations.  Gastrointestinal:  Positive for abdominal pain. Negative for blood in stool, constipation and diarrhea.  Genitourinary:  Positive for dysuria.  Skin:  Negative for rash.  Neurological:  Negative for weakness.  Psychiatric/Behavioral:  Positive for depression. Negative for suicidal ideas. The patient is nervous/anxious.      OBJECTIVE Physical Exam: Vitals:   01/21/24 1030  BP: 133/77  Pulse: 76  Weight: 156 lb (70.8 kg)  Height: 4' 10.4 (1.483 m)    Physical Exam Vitals reviewed. Exam conducted with a chaperone present.  Constitutional:      Appearance: Normal appearance.  Pulmonary:     Effort: Pulmonary effort is normal.  Abdominal:     Palpations: Abdomen is soft.  Neurological:     General: No focal deficit present.     Mental Status: She is alert and oriented to person, place, and time.  Psychiatric:         Mood and Affect: Mood normal.        Behavior: Behavior normal. Behavior is cooperative.        Thought Content: Thought content normal.      GU / Detailed Urogynecologic Evaluation:  Pelvic Exam: Normal external female genitalia; Bartholin's and Skene's glands normal in appearance; urethral meatus normal in appearance, no urethral masses or discharge.   CST: negative  Speculum exam reveals normal vaginal mucosa with atrophy. Cervix normal appearance. Uterus normal single, nontender. Adnexa normal adnexa.    With apex supported, anterior compartment defect was reduced  Pelvic floor strength I/V  Pelvic floor musculature: Right levator non-tender, Right obturator tender, Left levator tender, Left obturator tender  POP-Q:   POP-Q  -2.5  Aa   -2.5                                           Ba  -5.5                                              C   2.5                                            Gh  3                                            Pb  9.5                                            tvl   -3                                            Ap  -3                                            Bp  -8                                              D      Rectal Exam:  Normal external exam  Post-Void Residual (PVR) by Bladder Scan: In order to evaluate bladder emptying, we discussed obtaining a postvoid residual and patient agreed to this procedure.  Procedure: The ultrasound unit was placed on the patient's abdomen in the suprapubic region after the patient had voided.   Bladder Instillation: The patient was identified and verbally consented for the procedure.  The urethra was prepped with Betadine x 3. A 16 Fr foley catheter was inserted the bladder and drained for _40_ cc. The foley was then attached to a 60 mL syringe with the plunger removed.  The medication was slowly poured into the bladder via the syringe and foley.   The medication consisted of: 20ml of Lidocaine  2%, 20mL of Bupivicaine 0.25%, 10,000 units/mL Heparin , 5mL Sodium Bicarbonate  8.4%.  The foley was removed and the patient was asked to hold the liquids in her bladder for 30-60 minutes if possible.       Laboratory Results: Lab Results  Component Value Date   COLORU [facility] 10/02/2023   BILIRUBINUR Negative 02/08/2023   KETONESU CANCELED 10/02/2023   SPECGRAV CANCELED 10/02/2023   PHUR CANCELED 10/02/2023   PROTEINUR CANCELED 10/02/2023   UROBILINOGEN 0.2 01/31/2021   LEUKOCYTESUR Negative 02/08/2023    Lab Results  Component  Value Date   CREATININE 0.75 09/12/2022   CREATININE 0.74 01/31/2021   CREATININE 0.77 06/09/2020    No results found for: HGBA1C  Lab Results  Component Value Date   HGB 14.9 09/12/2022     ASSESSMENT AND PLAN Ms. Tiajah Oyster is a 73 y.o. with:  1. Urinary frequency   2. Hematuria, unspecified type   3. Levator spasm   4. Bladder irritation   5. Pelvic floor dysfunction in female    We discussed the symptoms of overactive bladder (OAB), which include urinary urgency, urinary frequency, nocturia, with or without urge incontinence.  While we do not know the exact etiology of OAB, several treatment options exist. We discussed management including behavioral therapy (decreasing bladder irritants, urge suppression strategies, timed voids, bladder retraining), physical therapy, medication.  Will send for micro Patient has significant pelvic floor tension. She is very tender on the left side and has extremely poor muscle control with no ability to do a kegel. She would benefit from pelvic floor PT.  Patient has had multiple concerns for UTI with negative cultures. We discussed this could be IC/Bladder pain syndrome. Bladder instillation done today to see if this helps. Also prescribed Hiprex  as an anti-infective to see if this assists in some of her symptoms.  Pateint to do pelvic floor PT.   Patient  to call and let me know if she wants to do more bladder instillations. She is in [place] so it is a far drive for her.Otherwise will plan for 4 month follow up after she has hopefully done some PT.     Cai Anfinson G Drucella Karbowski, NP

## 2024-01-21 NOTE — Patient Instructions (Addendum)
 Today we talked about ways to manage bladder urgency such as altering your diet to avoid irritative beverages and foods (bladder diet) as well as attempting to decrease stress and other exacerbating factors.  You can also chew a plain Tums 1-3 times per day to make your urine less acidic, especially if you have eating/drinking acidic things.   There is a website with helpful information for people with bladder irritation, called the IC Network at https://www.ic-network.com. This website has more information about a healthy bladder diet and patient forums for support.  The Most Bothersome Foods* The Least Bothersome Foods*  Coffee - Regular & Decaf Tea - caffeinated Carbonated beverages - cola, non-colas, diet & caffeine-free Alcohols - Beer, Red Wine, White Wine, Champagne Fruits - Grapefruit, [place], Orange, [employer] - Cranberry, Grapefruit, Orange, Pineapple Vegetables - Tomato & Tomato Products Flavor Enhancers - Hot peppers, Spicy foods, Chili, Horseradish, Vinegar, Monosodium glutamate (MSG) Artificial Sweeteners - NutraSweet, Sweet 'N Low, Equal (sweetener), Saccharin Ethnic foods - [place], [place], [place] food Water  Milk - low-fat & whole Fruits - Bananas, Blueberries, Honey[place]w melon, Pears, Raisins, Watermelon Vegetables - Broccoli, [address] Sprouts, [place], [place], Cauliflower, [place], Cucumber, Mushrooms, Peas, Radishes, Squash, Zucchini, White potatoes, Sweet potatoes & yams Poultry - Chicken, Eggs, [place], [employer] - Beef, Diplomatic Services operational officer, Lamb Seafood - Shrimp, [place] fish, Salmon Grains - Oat, Rice Snacks - Pretzels, Popcorn  *Mitch ALF et al. Diet and its role in interstitial cystitis/bladder pain syndrome (IC/BPS) and comorbid conditions. [employer]. BJU Int. 2012 Jan 11.    You can use vitamin E cream, aloe vera, or coconut oil for vaginal support of the tissues on nights you do not use the estrogen cream.

## 2024-04-16 IMAGING — MR MR LUMBAR SPINE W/O CM
5 series · 33 of 48 positions shown · non-contrast
Comparison: CT 07/24/2021

CLINICAL DATA: Low back pain for 8 months

EXAM:
MRI LUMBAR SPINE WITHOUT CONTRAST
TECHNIQUE: Multiplanar, multisequence MR imaging of the lumbar spine was
performed. No intravenous contrast was administered.

[Series 5: T2 · sagittal · 4.0mm · 0.81mm/px · 8 of 17 slices shown (1 of 2)]
[im 1/17]
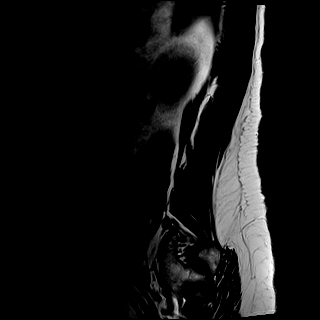
[im 3/17]
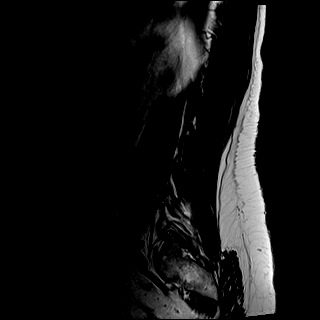
[im 5/17]
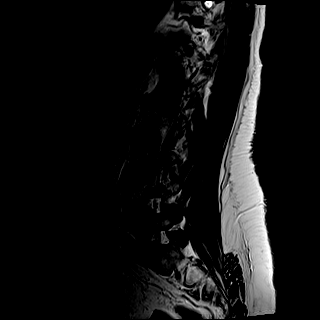
[im 7/17]
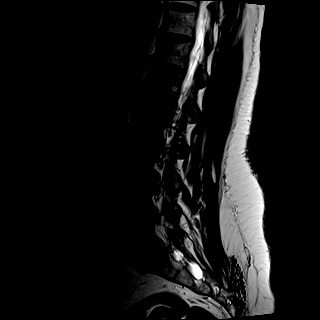
[im 10/17]
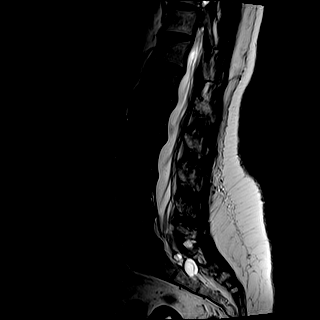
[im 12/17]
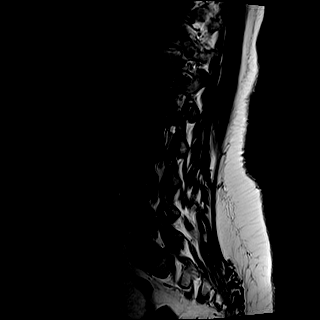
[im 14/17]
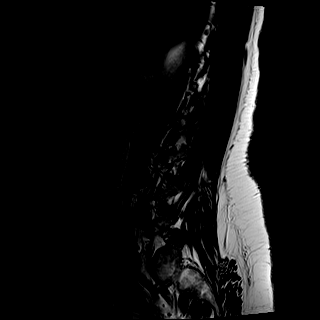
[im 17/17]
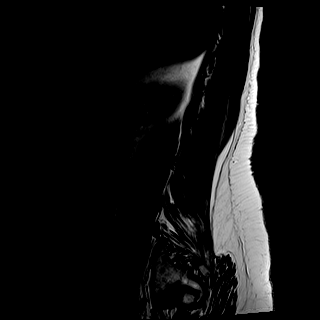

[Series 6: T1 · sagittal · 4.0mm · 0.81mm/px · 7 of 17 slices shown (1 of 2)]
[im 1/17]
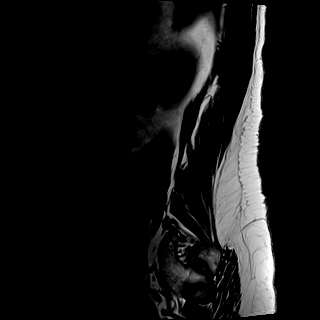
[im 3/17]
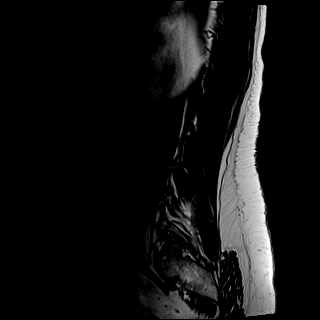
[im 6/17]
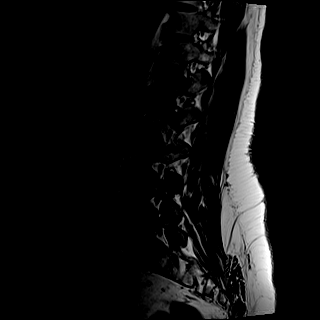
[im 9/17]
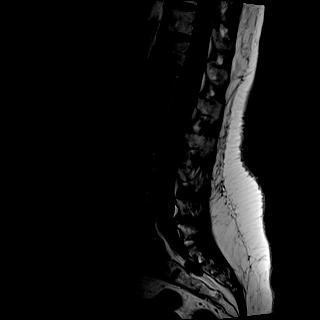
[im 11/17]
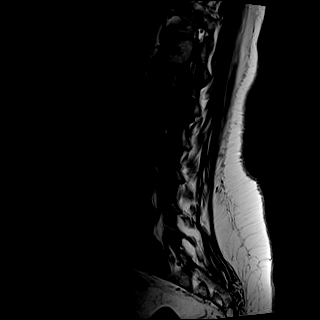
[im 14/17]
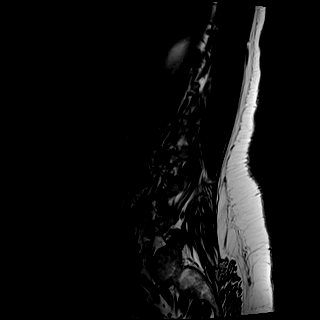
[im 17/17]
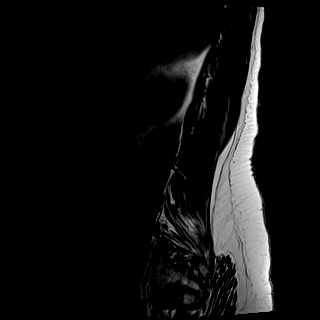

[Series 7: STIR · sagittal · 4.0mm · 0.41mm/px · 1 of 17 slices shown]
[im 1/17]
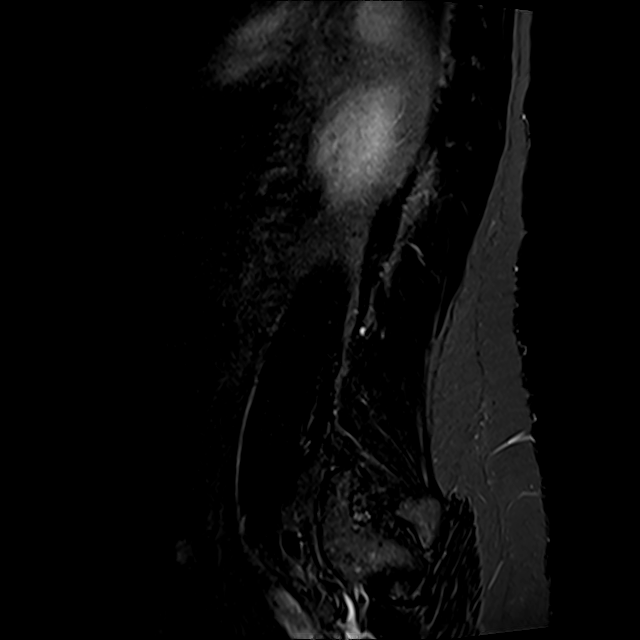

[Series 8: T2 · axial · 4.0mm · 0.78mm/px · z∈[-147,+43]mm · 9 of 29 slices shown (2 of 2)]
[im 1/29]
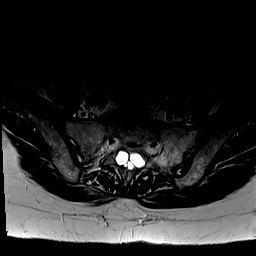
[im 6/29]
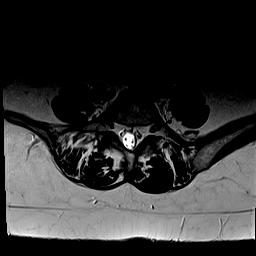
[im 8/29]
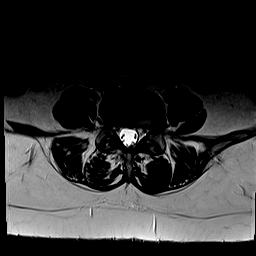
[im 13/29]
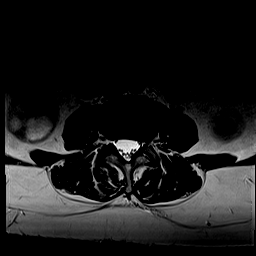
[im 16/29]
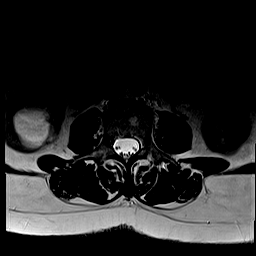
[im 21/29]
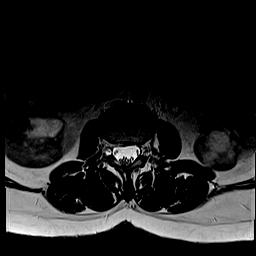
[im 23/29]
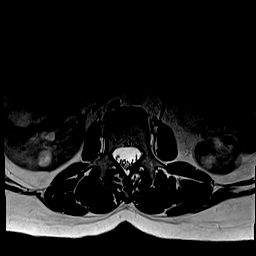
[im 26/29]
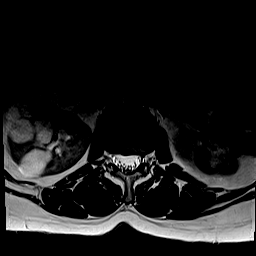
[im 29/29]
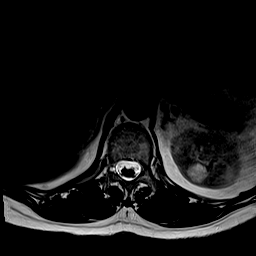

[Series 9: T1 · axial · 4.0mm · 0.39mm/px · z∈[-147,+43]mm · 8 of 33 slices shown (2 of 2)]
[im 1/33]
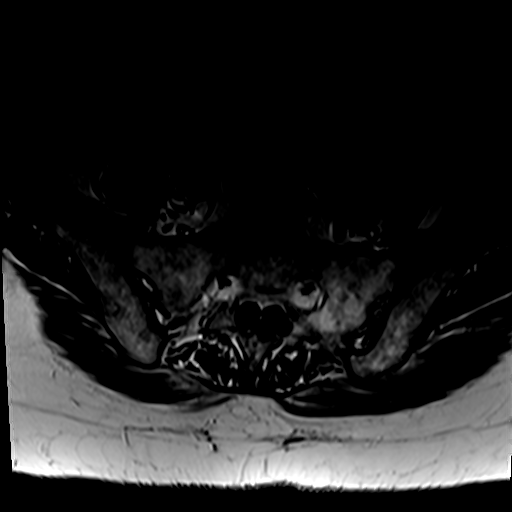
[im 5/33]
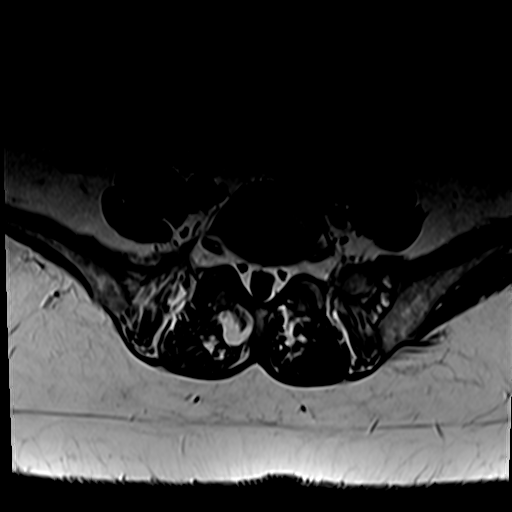
[im 10/33]
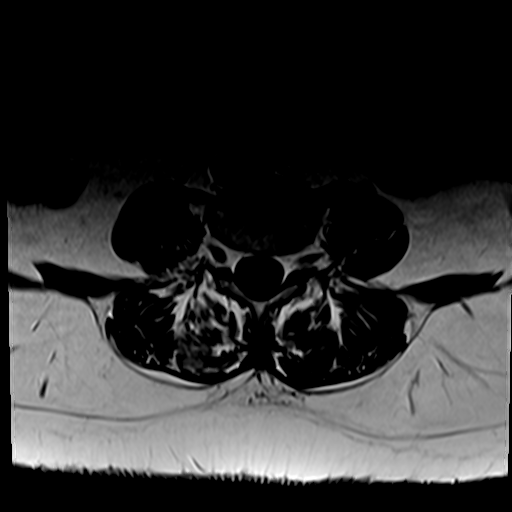
[im 15/33]
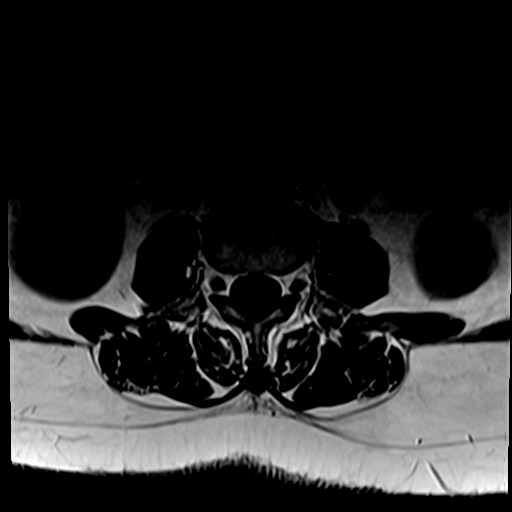
[im 18/33]
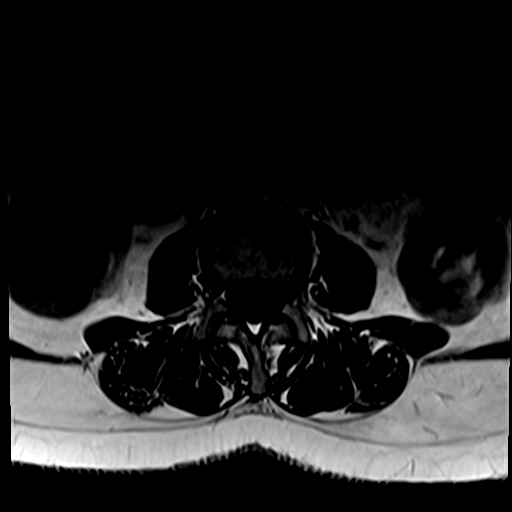
[im 23/33]
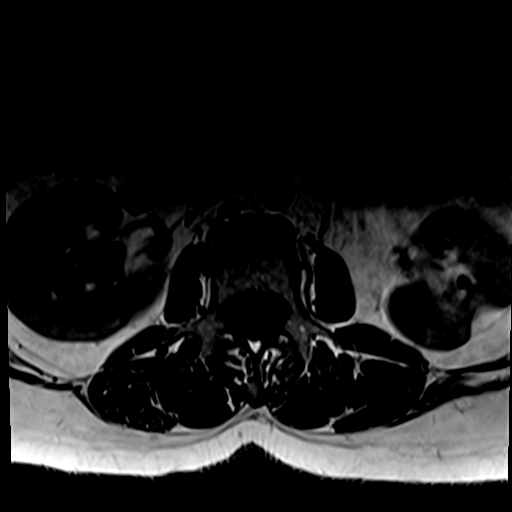
[im 28/33]
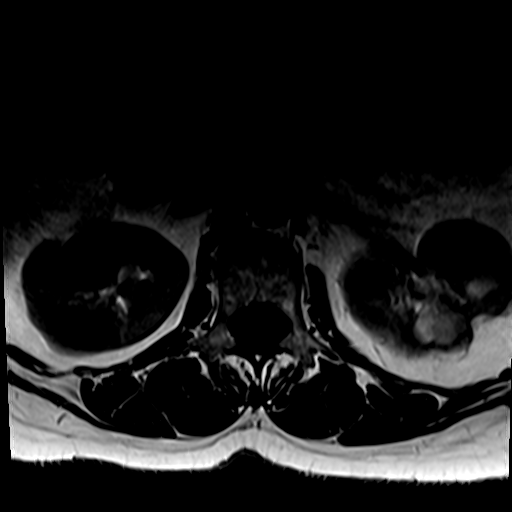
[im 33/33]
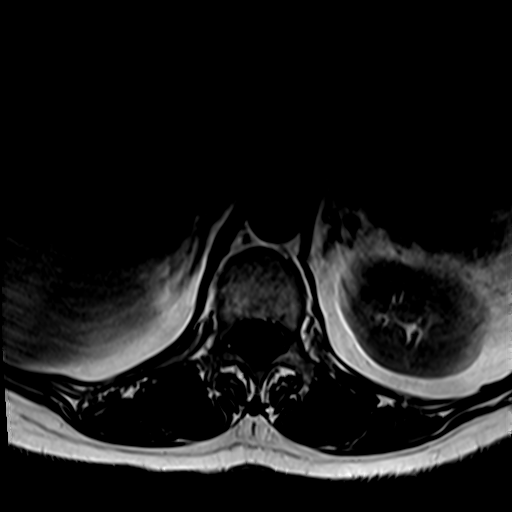

[33 of 48 positions shown; findings below may reference images not displayed]

FINDINGS: Segmentation:  Standard.

Alignment:  Physiologic.

Vertebrae: No acute fracture, evidence of discitis, or bone lesion.
Chronic mild superior endplate compression fracture of T12 is
unchanged. Mild discogenic endplate marrow changes anteriorly at
L1-2.

Conus medullaris and cauda equina: Conus extends to the L1 level.
Conus and cauda equina appear normal.

Paraspinal and other soft tissues: Bilateral renal cysts with
left-sided cortical scarring, similar in appearance to CT abdomen on
07/24/2021. No acute findings.

Disc levels:

T12-L1: Shallow left paracentral disc protrusion. Unremarkable facet
joints. No foraminal or canal stenosis.

L1-L2: Shallow central disc protrusion. Unremarkable facet joints.
No foraminal or canal stenosis.

L2-L3: Shallow right paracentral disc protrusion. Unremarkable facet
joints. No foraminal or canal stenosis.

L3-L4: Mild annular disc bulge. Unremarkable facet joints. No
foraminal or canal stenosis.

L4-L5: Mild annular disc bulge, slightly eccentric to the left. Mild
bilateral facet arthropathy. No foraminal or canal stenosis.

L5-S1: Minimal annular disc bulge. Mild bilateral facet arthropathy.
No foraminal or canal stenosis.
IMPRESSION: 1. Mild multilevel degenerative disc disease and facet arthropathy.
No foraminal or canal stenosis at any level.
2. Chronic mild superior endplate compression fracture of T12. No
acute osseous abnormality.
